# Patient Record
Sex: Male | Born: 1980 | Race: White | Hispanic: No | Marital: Married | Smoking: Current every day smoker
Health system: Southern US, Community
[De-identification: ages and names within clinical notes are randomized; demographics above are authoritative.]

## PROBLEM LIST (undated history)

## (undated) DIAGNOSIS — N289 Disorder of kidney and ureter, unspecified: Secondary | ICD-10-CM

## (undated) DIAGNOSIS — K4090 Unilateral inguinal hernia, without obstruction or gangrene, not specified as recurrent: Secondary | ICD-10-CM

## (undated) HISTORY — PX: WRIST SURGERY: SHX841

---
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
## 2001-01-10 ENCOUNTER — Emergency Department (HOSPITAL_COMMUNITY): Admission: EM | Admit: 2001-01-10 | Discharge: 2001-01-11 | Payer: Self-pay | Admitting: Emergency Medicine

## 2001-01-11 ENCOUNTER — Encounter: Payer: Self-pay | Admitting: Emergency Medicine

## 2001-01-25 ENCOUNTER — Encounter: Payer: Self-pay | Admitting: Orthopedic Surgery

## 2001-01-25 ENCOUNTER — Ambulatory Visit (HOSPITAL_COMMUNITY): Admission: RE | Admit: 2001-01-25 | Discharge: 2001-01-25 | Payer: Self-pay | Admitting: Orthopedic Surgery

## 2001-03-11 ENCOUNTER — Ambulatory Visit (HOSPITAL_BASED_OUTPATIENT_CLINIC_OR_DEPARTMENT_OTHER): Admission: RE | Admit: 2001-03-11 | Discharge: 2001-03-11 | Payer: Self-pay | Admitting: Orthopedic Surgery

## 2002-08-31 ENCOUNTER — Encounter: Payer: Self-pay | Admitting: Emergency Medicine

## 2002-08-31 ENCOUNTER — Emergency Department (HOSPITAL_COMMUNITY): Admission: EM | Admit: 2002-08-31 | Discharge: 2002-08-31 | Payer: Self-pay | Admitting: Emergency Medicine

## 2003-04-30 ENCOUNTER — Emergency Department (HOSPITAL_COMMUNITY): Admission: EM | Admit: 2003-04-30 | Discharge: 2003-04-30 | Payer: Self-pay | Admitting: *Deleted

## 2005-01-03 ENCOUNTER — Emergency Department (HOSPITAL_COMMUNITY): Admission: EM | Admit: 2005-01-03 | Discharge: 2005-01-03 | Payer: Self-pay | Admitting: Emergency Medicine

## 2006-03-25 ENCOUNTER — Emergency Department (HOSPITAL_COMMUNITY): Admission: EM | Admit: 2006-03-25 | Discharge: 2006-03-25 | Payer: Self-pay | Admitting: Emergency Medicine

## 2006-12-15 ENCOUNTER — Ambulatory Visit (HOSPITAL_BASED_OUTPATIENT_CLINIC_OR_DEPARTMENT_OTHER): Admission: RE | Admit: 2006-12-15 | Discharge: 2006-12-16 | Payer: Self-pay | Admitting: Orthopedic Surgery

## 2006-12-15 ENCOUNTER — Encounter (INDEPENDENT_AMBULATORY_CARE_PROVIDER_SITE_OTHER): Payer: Self-pay | Admitting: Specialist

## 2007-03-01 ENCOUNTER — Ambulatory Visit (HOSPITAL_BASED_OUTPATIENT_CLINIC_OR_DEPARTMENT_OTHER): Admission: RE | Admit: 2007-03-01 | Discharge: 2007-03-01 | Payer: Self-pay | Admitting: Orthopedic Surgery

## 2007-08-19 ENCOUNTER — Emergency Department (HOSPITAL_COMMUNITY): Admission: EM | Admit: 2007-08-19 | Discharge: 2007-08-19 | Payer: Self-pay | Admitting: Emergency Medicine

## 2011-02-10 NOTE — Op Note (Signed)
NAME:  FARMER, MCCAHILL NO.:  [id number]   MEDICAL RECORD NO.:  [id number]          PATIENT TYPE:  AMB   LOCATION:  DSC                          FACILITY:  [facility]   PHYSICIAN:  Katy Fitch. Sypher, M.D. DATE OF BIRTH:  1980-11-13   DATE OF PROCEDURE:  03/01/2007  DATE OF DISCHARGE:                               OPERATIVE REPORT   PREOPERATIVE DIAGNOSIS:  Status post autogenous iliac crest bone graft  to reconstruct a chronic right scaphoid nonunion with two interosseous  and submuscular 0.045-inch Kirschner wires.   POSTOPERATIVE DIAGNOSIS:  Status post autogenous iliac crest bone graft  to reconstruct a chronic right scaphoid nonunion with two interosseous  and submuscular 0.045-inch Kirschner wires.   OPERATION:  Removal of two scaphoid and submuscular Kirschner wires from  right wrist.   OPERATIONS:  Katy Fitch. Sypher, M.D.   ASSISTANT:  No assistant.   ANESTHESIA:  General by [facility].   SUPERVISING ANESTHESIOLOGIST:  Sheldon Silvan, M.D.   INDICATIONS:  Nesanel Aguila is a 30 year old gentleman who is status  post fracture of his right scaphoid.  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  He  presented for evaluation and management of a painful and stiff wrist and  was noted to have a chronic middle third nonunion.  He is now  approaching three months status post ORIF with an autogenous iliac crest  cancellus graft and parallel 0.045 inch Kirschner wire internal  fixation.  These K-wires were buried in the thenar muscles to facilitate  postoperative removal.   After informed consent, he is brought to the operating at this time.  He  was carefully advised that he must not return to riding his motorcycle  nor other vigorous activities until we are certain that his scaphoid is  healed once his Kirschner wires are removed.  Preoperatively  questions  were invited and answered.   After an anesthesia consult with Dr. Ivin Booty, it was elected to attempt  pin removal under MAC anesthesia, however, given his large size and the  depth of the pins it may be necessary to convert to general anesthesia.   After informed consent he is brought to the operating room at this time.   PROCEDURE:  Jancarlo Biermann was brought to the operating room and placed  in the supine position upon the operating table.   Following the induction of light sedation, the right arm was prepped  with Betadine soap and solution and sterilely draped.  The arm was  elevated for one minute and the arterial tourniquet inflated to 260 mmHg  due to mild systolic hypertension.   We infiltrated 2% lidocaine in the path of the intended incision.  This  was a bit uncomfortable for Mr. Filip and due to his level of sedation  and withdrawal reaction, it was decided to proceed with general  anesthesia by mask.  Further Diprivan and general anesthetic agents were  provided.  When anesthesia was satisfactory, we proceeded to perform a  small elliptical  excision of scar from the thenar eminence directly over  the position of the pins.  Subcutaneous tissues were divided with  tenotomy scissors and gentle dissection.  The pins were identified by  palpation followed by muscle-splitting and use of a medium needle driver  to remove the two parallel Kirschner wires.  The wound was inspected for  bleeding.  None was identified.  The wound was subsequently repaired  with two Steri-Strips.   A voluminous sterile gauze dressing with sterile Webril and forearm  based thumb Spica splint applied.  There were no apparent complications.  Mr. Raper was awakened from his sedation, transferred to the recovery  room with stable signs.  He will return to our office for follow-up and  a check x-ray in one week.  For aftercare he is provided prescription  for Vicodin 5 mg one p.o. q.4-6h.  p.r.n. pain, 20 tablets without  refill.   We will see him back sooner p.r.n. problems with his splint or other  postoperative issues.      Katy Fitch Sypher, M.D.  Electronically Signed     RVS/MEDQ  D:  03/01/2007  T:  03/01/2007  Job:  [id number]

## 2011-02-13 NOTE — Op Note (Signed)
NAME:  Reginald Castillo, Reginald Castillo NO.:  [id number]   MEDICAL RECORD NO.:  [id number]          PATIENT TYPE:  AMB   LOCATION:  DSC                          FACILITY:  [facility]   PHYSICIAN:  Katy Fitch. Sypher, M.D. 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   OPERATION:  1. Curettage of avascular bone from right scaphoid proximal pole      avascular fracture fragment.  2. Open reduction internal fixation of right scaphoid utilizing an      autogenous cancellus iliac bone graft and fixation with two 0.045-      inch Kirschner wires that were buried deep to the thenar muscles.   OPERATING SURGEON:  Josephine Igo, M.D.   ASSISTANT:  Molly Maduro Dasnoit PA-C.   ANESTHESIA:  General by [facility], supervising anesthesiologist is Dr. Krista Blue.   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   We have treated him previously for a right scaphoid fracture sustained  while playing high school  football in 2001.   At the time of his initial injury, he did not seek treatment thinking  that he had sprained his wrist.  He subsequently presented for  evaluation of his wrist in 2002 and was noted to have an established  proximal pole nonunion.   We recommended open reduction internal fixation with an autogenous  graft.  Surgery was accomplished on March 11, 2001 with placement of a  bone graft and pin fixation.  On 06/16/2001 he subsequently underwent  removal of buried Kirschner wires.  He was followed for a very brief  period following pin removal and was last seen on 07/11/2001.   At that time it appeared that his fracture was proceeding to a  satisfactory union.  He was essentially lost to follow-up for the  subsequent 5-1/2 years.  He returned our office on November 25, 2006,  reporting the onset of new pain in his wrist and was worried that he may  have refractured his scaphoid.  X-rays obtained at our office at that  time indeed documented that he had an established nonunion of the  proximal pole with considerable sclerosis of the proximal pole  suggesting avascular necrosis.   We advised Reginald Castillo that this is  a complex predicament, particularly  when he is now approaching seven years following his original injury.  However, we recommended a second attempt at open reduction internal  fixation utilizing an autogenous cancellous graft from the iliac crest  to optimize our healing potential.   Preoperatively he was advised of the technical issues involved in  fracture fixation with a small proximal pole fragment.  We advised him  that we would likely use either a mini Accu-Trac screw or Kirschner  wires.   He does understand from his past experience that we will likely need to  retrieve the hardware and that he may have some challenges with the  buried hardware in the early postoperative period.  We have explained  the technique of iliac grafting and have shared with Mr.  Castillo that he  may find that his iliac graft site may be more painful than the wrist  surgical site.  He understands that he may limp for a period of time and  could have prolonged pain that requires additional treatment at the  donor site.   In our experience, donor site pain is extremely where but not unheard  of.   After informed consent he is brought to the operating room at this time.   PROCEDURE:  [name] is brought to the operating room and  placed in supine position on the operating table.   Following an anesthesia consult, general anesthesia was recommended and  accepted.   He was taken to room 1, placed in supine position on the operating table  and after the induction of general anesthesia by LMA technique, the  right arm and right iliac crest region were prepped with Betadine soap  and solution and DuraPrep at the hip site.   A pneumatic tourniquet was applied at proximal right brachium.   Following exsanguination of right arm with an Esmarch bandage, the  arterial tourniquet was inflated to 250 mmHg due to mild systolic  hypertension.   Procedure commenced with resection of the prior surgical scar.  Subcutaneous tissue was carefully divided taking care to identify and  gently retract the radial sensory branches.  The fourth dorsal  compartment was released over a distance of 8 mm to allow retraction of  the extensor tendons and a dorsal capsulotomy performed across the floor  of the second, third and part of the fourth dorsal compartment.  The  nonunion site was easily visualized with multiple free fragments of  avascular bone noted at the nonunion site essentially trapped in an  amorphous hyaline cartilage nonunion.   An osteotome was used to resect the nonunion followed by use of a  curette and power bur to excavate the proximal pole.  Samples of bone from the proximal pole marrow were absolutely avascular and had the  consistency of ivory.  The  distal pole had bleeding cancellus bone.   After complete excavation of proximal pole, AP lateral C-arm images were  obtained documenting satisfactory preparation of the bone.  Attention  was then directed to [name] right anterior iliac crest.  A 2 cm  incision was fashioned and carried down through a considerable amount of  adipose tissue.  The iliac crest was identified and the interval between  the external oblique muscle and the tensor fascia lata was identified.  A 15 mm incision was fashioned elevating the periosteum.  A bone graft  measuring 6 mm in width, 8 mm in length and 8 mm in depth was harvested.  About  1 cc of cancellus bone was then removed with a large caliber  curette.  The donor site was then repaired with mattress suture of 0-0  Vicryl, creating a watertight closure of the fascia and the aponeurosis  of the external oblique muscle and the tensor fascia lata.  Subcutaneous  tissues repaired with 0-0 Vicryl closing the dead space created in the  subcutaneous fat.   The cancellus graft was then carefully packed at the nonunion site into  the proximal pole cavity created by burring and curettage.  The fragment  of the iliac graft was used to lengthen the scaphoid initially by  compressing it with a needle driver inserting it and allowing it to  expand within the cavity created in the proximal distal poles.   Two 0.045 inches Kirschner wires were then placed with parallel  technique with a drill guide through the proximal and distal pole of the  scaphoid trapping the large fragment of iliac graft and satisfactorily  immobilizing the scaphoid.   One of the pins grazed the distal trapezium; however, given the  challenges to obtain good pin placement in the setting, I accepted the  fact that the scaphotrapezial joint will be temporarily secured by the  pin.   Mr. Shipes will be protected throughout healing with a cast until we  remove the pins.   AP lateral  images were obtained documenting very satisfactory position  of the graft as well as the Kirschner wires which were parallel and  should allow gradual compression with wrist positioning in slight  dorsiflexion and radial deviation.   The pins were trimmed and tamped below the level the hyaline cartilage.  These were brought out to the thenar muscles and through a separate  incision the pins bent and buried within the thenar muscles.   Mr. Duchemin understands that these will be annoying in the thenar muscles  but should allow [place] easy access for pin removal once the scaphoid  appears to have adequate healing at about 10-12 weeks postop.   The pin tracts were carefully inspected through a thenar incision to be  certain that there were no neurovascular structures entrapped or other  muscle issues.  The pins will lie within the abductor pollicis brevis  muscle.  The wounds were then irrigated followed by repair of the capsule with  multiple figure-of-eight sutures of 3-0 Ethibond followed by repair of  the retinaculum with 3-0 Ethibond simple suture and repair of the skin  with subdermal suture of 3-0 Vicryl and intradermal 3-0 Prolene.   Mr. Stroupe was placed in compressive dressing on the hand followed by a  sugar-tong splint maintaining the forearm in neutral position and the  wrist in a few degrees dorsiflexion.  There were no apparent  complications.  The tourniquet released.  Tourniquet pressure was 250  mmHg throughout procedure.   The iliac graft site was dressed with sterile gauze and Tegaderm.  We  anticipate observing Mr. Haug for 24 hours for IV antibiotics in form  of Ancef 1 gram IV q.8 h. as a prophylactic antibiotic and appropriate  pain medication to manage both operative sites.      Katy Fitch Sypher, M.D.  Electronically Signed    RVS/MEDQ  D:  12/15/2006  T:  12/15/2006  Job:  [id number]

## 2011-02-13 NOTE — Op Note (Signed)
[place]  Patient:    Reginald Castillo, Reginald Castillo                       MRN: [id number] Proc. Date: 03/11/01 Adm. 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  OPERATION/PROCEDURE: Open reduction and internal fixation of right scaphoid proximal pole fracture with autogenous distal radial bone graft.  SURGEON: Katy Fitch. Sypher, Montez Hageman., M.D.  ASSISTANT: Jonni Sanger, P.A.  ANESTHESIA: General by LMA.  ANESTHESIOLOGIST: Janetta Hora. Gelene Mink, M.D.  INDICATIONS FOR PROCEDURE: Reginald Castillo is a 30 year old who sustained a fracture of the proximal pole of his right scaphoid approximately nine months previously while playing football.  He was noted to have a possible fracture versus a sprain.  He did not seek acute medical evaluation and care.  He finished his football season and continued to have wrist discomfort.  He was evaluated by his primary care physician and referred for an upper endoscopy orthopedic consultation.  Clinical examination revealed wrist tenderness, loss of dorsiflexion, and loss of grip strength.  X-rays documented a displaced proximal pole fracture with increased density consistent with avascular necrosis.  He was referred for an MRI, which confirmed avascular necrosis of the proximal pole and significant edema of the distal fragment.  We recommended autogenous grafting and either Acutrak screw or Kirschner wire fixation.  Preoperatively we advised Mr. Cuartas that this was a  complex injury with difficult biological circumstances.  He was advised that the proximal pole does not have an adequate blood supply to allow primary healing in a cast. The goal of bone grafting and internal fixation is to increase the chance of revascularization of the proximal pole fragment and healing of the fracture. He was advised on two occasions in the office and in the preoperative holding area that there are potential complications of surgery including infection, anesthetic risks, a possible failure of fixation, possible failure of union, and the need for further surgery should he not go on to a primary union after this effort.  In the holding area he could not recall most of our original consultation. Therefore, I had a detailed informed consent with two witnesses present; i.e., Marveen Reeks Dasnoit, P.A. and one of the staff nurses in the preoperative holding area.  In addition, I had asked his parents to come to the office for preoperative consultation prior to surgery.  Due to family illness they were unable to attend this in the week prior to surgery.  Therefore, I contacted Christa See mother on the phone this morning and had a five minute conversation with her describing the surgery, aftercare, potential risks and benefits, as well as the possibility that this would not heal and would require further surgery.  Both Reginald Castillo and his mother understand that should this fracture not heal he is at risk for developing premature wrist arthritis.  After informed consent and questions being answered he is brought to the operating room at this time.  DESCRIPTION OF PROCEDURE: Dewitt Judice was  brought to the operating room and placed in the supine position on the operating room table.  Following induction of general anesthesia by LMA the right arm was prepped with Betadine soap and solution and sterilely draped.  The right anterior iliac crest region was prepped with  DuraPrep and drained with impervious arthroscopy drapes.  The procedure commenced with exsanguination of the limb with an Esmarch bandage and inflation of the arterial tourniquet to 240 mm Hg.  The fracture was exposed through a dorsal transverse incision, exposing the second, third, and fourth dorsal compartments.  The dorsal wrist capsule was incised in the line of the fibers of the dorsal radiocarpal ligament, exposing the proximal pole of the scaphoid and scapholunate interosseous ligament.  The scapholunate interosseous ligament was intact.  The proximal pole fracture was impacted with some loss of normal hyaline reticular cartilage dorsally.  There was a mild humpback deformity noted.  The fracture site was easily sounded with a 25 gauge needle and opened with a 4 mm osteotome.  There was absolutely no healing.  With a House curet a small portion of the marrow of the distal fragment was removed, creating a concavity to accept bone graft.  There was excellent bleeding in the distal fragment. The marrow of the proximal pole fragment was saponified and very sclerotic. This was with great difficulty resected with a House curet with use of suction to remove bony debris.  The wrist was then thoroughly irrigated with sterile saline.  Attention was directed to the distal radius.  The floor of the second dorsal compartment was exposed by retracting the skin proximally.  The dorsal cortex was windowed with a 4 mm osteotome and a bone graft measuring 6 x 4 x 4 mm was harvested with osteotomes.  He had excellent bone quality in the distal radius due to his youth and heavy physical exercise.  This was fashioned to a flattened football shape and was then placed in the defect created by curettage, and the scaphoid was impacted by dorsiflexion of the wrist and radial deviation with firm pressure.  The bone graft filled the cavity created by bone resection quite satisfactorily.  Two 0.045  inch Kirschner wires were then drilled the length of the scaphoid from dorsal to palmar, taking care to exit the distal pole adjacent to the scaphotrapezial  articulation.  These were brought through the thenar muscles and under direct vision were brought distally until they were buried within the proximal pole of the scaphoid.  The fracture was then impacted with a small impactor with a mallet, and care was taken to assure that the Kirschner wires were deep to the surface of the hyaline articular cartilage at least 1 mm.  A C-arm fluoroscopic image was obtained and documented satisfactory position of the fracture and satisfactory position of the Kirschner wires.  The wires were then bent and trimmed and buried deep to the skin in the thenar eminence.  These will be removed by secondary surgery approximately six to ten weeks following surgery if we have satisfactory healing of the fracture noted.  The wound was then thoroughly irrigated and the capsule repaired with interrupted sutures of 4-0 Vicryl, followed by repair of the retinaculum with interrupted sutures of 4-0 Vicryl, and repair of the skin with interdermal 3-0 Prolene suture.  The hand was placed in a voluminous gauze dressing with the wrist dorsiflexed 30 degrees and slightly radially deviated to impact the fracture.  There were no apparent complications.  Mr. Currier was  given 1 g of Ancef as an IV prophylatic antibiotic.  For aftercare he will be discharged home to the care of his parents with prescriptions for Motrin 600 mg one p.o. q.6h p.r.n. pain, Percocet 5 mg one to two tablets p.o. q.4h to q.6h p.r.n. pain (#30 tablets without refill), and Keflex 500 mg one p.o. q.8h x 4 days as prophylactic antibiotic. DD:  03/11/01 TD:  03/11/01 Job: [id number]

## 2011-03-19 ENCOUNTER — Emergency Department (HOSPITAL_COMMUNITY): Payer: 59

## 2011-03-19 ENCOUNTER — Emergency Department (HOSPITAL_COMMUNITY)
Admission: EM | Admit: 2011-03-19 | Discharge: 2011-03-19 | Disposition: A | Discharge status: Home / Self Care | Payer: 59 | Attending: Emergency Medicine | Admitting: Emergency Medicine

## 2011-03-19 DIAGNOSIS — R079 Chest pain, unspecified: Secondary | ICD-10-CM | POA: Insufficient documentation

## 2011-03-19 DIAGNOSIS — R6883 Chills (without fever): Secondary | ICD-10-CM | POA: Insufficient documentation

## 2011-03-19 DIAGNOSIS — R300 Dysuria: Secondary | ICD-10-CM | POA: Insufficient documentation

## 2011-03-19 DIAGNOSIS — R109 Unspecified abdominal pain: Secondary | ICD-10-CM | POA: Insufficient documentation

## 2011-03-19 DIAGNOSIS — R112 Nausea with vomiting, unspecified: Secondary | ICD-10-CM | POA: Insufficient documentation

## 2011-03-19 DIAGNOSIS — N201 Calculus of ureter: Secondary | ICD-10-CM | POA: Insufficient documentation

## 2011-03-19 LAB — URINALYSIS, ROUTINE W REFLEX MICROSCOPIC
Bilirubin Urine: NEGATIVE
Glucose, UA: NEGATIVE mg/dL
Leukocytes, UA: NEGATIVE
Protein, ur: NEGATIVE mg/dL
Specific Gravity, Urine: 1.028 (ref 1.005–1.030)
Urobilinogen, UA: 0.2 mg/dL (ref 0.0–1.0)
pH: 5.5 (ref 5.0–8.0)

## 2011-03-19 LAB — DIFFERENTIAL
Eosinophils Absolute: 0.5 10*3/uL (ref 0.0–0.7)
Lymphs Abs: 3.7 10*3/uL (ref 0.7–4.0)
Monocytes Relative: 10 % (ref 3–12)

## 2011-03-19 LAB — CBC
Hemoglobin: 17 g/dL (ref 13.0–17.0)
MCH: 30.5 pg (ref 26.0–34.0)
MCHC: 35.9 g/dL (ref 30.0–36.0)
MCV: 84.8 fL (ref 78.0–100.0)
Platelets: 344 10*3/uL (ref 150–400)
RBC: 5.58 MIL/uL (ref 4.22–5.81)

## 2011-03-19 LAB — BASIC METABOLIC PANEL
Calcium: 9.8 mg/dL (ref 8.4–10.5)
Creatinine, Ser: 1.03 mg/dL (ref 0.50–1.35)
GFR calc Af Amer: >60 mL/min (ref >60)
GFR calc non Af Amer: >60 mL/min (ref >60)
Potassium: 3.8 mEq/L (ref 3.5–5.1)
Sodium: 140 mEq/L (ref 135–145)

## 2012-02-10 ENCOUNTER — Emergency Department (HOSPITAL_COMMUNITY)
Admission: EM | Admit: 2012-02-10 | Discharge: 2012-02-10 | Disposition: A | Discharge status: Home / Self Care | Payer: 59 | Attending: Emergency Medicine | Admitting: Emergency Medicine

## 2012-02-10 ENCOUNTER — Encounter (HOSPITAL_COMMUNITY): Payer: Self-pay

## 2012-02-10 DIAGNOSIS — T622X1A Toxic effect of other ingested (parts of) plant(s), accidental (unintentional), initial encounter: Secondary | ICD-10-CM | POA: Insufficient documentation

## 2012-02-10 DIAGNOSIS — R030 Elevated blood-pressure reading, without diagnosis of hypertension: Secondary | ICD-10-CM | POA: Insufficient documentation

## 2012-02-10 DIAGNOSIS — L255 Unspecified contact dermatitis due to plants, except food: Secondary | ICD-10-CM | POA: Insufficient documentation

## 2012-02-10 DIAGNOSIS — L237 Allergic contact dermatitis due to plants, except food: Secondary | ICD-10-CM

## 2012-02-10 DIAGNOSIS — F17200 Nicotine dependence, unspecified, uncomplicated: Secondary | ICD-10-CM | POA: Insufficient documentation

## 2012-02-10 MED ORDER — PREDNISONE 20 MG PO TABS
60.0000 mg | ORAL_TABLET | Freq: Once | ORAL | Status: AC
  Administered 2012-02-10: 60 mg via ORAL
  Filled 2012-02-10: qty 3

## 2012-02-10 NOTE — ED Provider Notes (Signed)
History  This chart was scribed for Doug Sou, MD by Bennett Scrape. This patient was seen in room STRE2/STRE2 and the patient's care was started at 11:42PM.  CSN: [id number]  Arrival date & time 02/10/12  1109   First MD Initiated Contact with Patient 02/10/12 1142      Chief Complaint  Patient presents with  . Rash    The history is provided by the patient. No language interpreter was used.    Reginald Castillo is a 31 y.o. male who presents to the Emergency Department complaining of a gradual onset, gradually worsening, constant itchy rash that is on his back, arms and eyelids that appeared 4 days ago after he was out walking in the woods. He reports using creams with no improvement in his symptoms. Scratching improves the itching but he states that the rash spreads afterwards. He also c/o one week of sudden onset, gradually improving abrasion on his upper back. The pain is worse when he sweats. He denies fever, emesis and diarrhea as associated symptoms. He has no h/o chronic medical conditions. He is a current everyday smoker but denies alcohol use.  No PCP.   History reviewed. No pertinent past medical history.  History reviewed. No pertinent past surgical history.  History reviewed. No pertinent family history.  History  Substance Use Topics  . Smoking status: Current Everyday Smoker  . Smokeless tobacco: Not on file  . Alcohol Use: No      Review of Systems  Constitutional: Negative.   HENT: Negative.   Respiratory: Negative.   Cardiovascular: Negative.   Gastrointestinal: Negative.   Musculoskeletal: Negative.   Skin: Positive for rash.  Neurological: Negative.   Hematological: Negative.   Psychiatric/Behavioral: Negative.     Allergies  Review of patient's allergies indicates no known allergies.  Home Medications   Current Outpatient Rx  Name Route Sig Dispense Refill  . OVER THE COUNTER MEDICATION Topical Apply 1 application topically daily as  needed. Poison Ivy cream.      Triage Vitals: BP 151/95  Pulse 67  Temp(Src) 98.1 F (36.7 C) (Oral)  Resp 20  SpO2 100%  Physical Exam  Nursing note and vitals reviewed. Constitutional: He is oriented to person, place, and time. He appears well-developed and well-nourished. No distress.  HENT:  Head: Normocephalic and atraumatic.  Mouth/Throat: Oropharynx is clear and moist. No oropharyngeal exudate.       Handling secretions  Eyes: EOM are normal.  Neck: Neck supple. No tracheal deviation present.  Cardiovascular: Normal rate.   Pulmonary/Chest: Effort normal. No respiratory distress.  Abdominal: He exhibits no distension.  Musculoskeletal: Normal range of motion.  Neurological: He is alert and oriented to person, place, and time.  Skin: Skin is warm and dry. Rash noted.       Hive like rash on both forearms and a pink lesion on eyelids, pinkish streak approximately 10 cm long on upper back that looks like a healing abrasion  Psychiatric: He has a normal mood and affect. His behavior is normal.    ED Course  Procedures (including critical care time)  DIAGNOSTIC STUDIES: Oxygen Saturation is 100% on room air, normal by my interpretation.    COORDINATION OF CARE: 11:45AM-Advised pt to wash bed sheets, wash his hands frequently and to use benadryl as need for the itching. Will give pt prednisone for 2 weeks. Pt is driving himself home.   Labs Reviewed - No data to display No results found.   No diagnosis found.  MDM  Plan: prescription prednisone 60-60 50 50 40 40 30 30 20 20 10  10, benadryl 50 qid prn itch, bp recheck  Diagnosis #1 poison ivy #2 elevated blood pressure   02/10/2012  I personally performed the services described in this documentation, which was scribed in my presence. The recorded information has been reviewed and considered.  Doug Sou, MD 02/10/12 [phone]

## 2012-02-10 NOTE — Discharge Instructions (Signed)
Poison Ivy Take Benadryl 2 tablets or 4 times daily as needed for itch. Take the medicine prescribed which will help to clear up the rash over the next several days. Oatmeal baths may help to see if your skin .You should get your blood pressure rechecked within the next 3 weeks. Today's was high at 151/95 Poison ivy is a rash caused by touching the leaves of the poison ivy plant. The rash often shows up 48 hours later. You might just have bumps, redness, and itching. Sometimes, blisters appear and break open. Your eyes may get puffy (swollen). Poison ivy often heals in 2 to 3 weeks without treatment. HOME CARE  If you touch poison ivy:   Wash your skin with soap and water right away. Wash under your fingernails. Do not rub the skin very hard.   Wash any clothes you were wearing.   Avoid poison ivy in the future. Poison ivy has 3 leaves on a stem.   Use medicine to help with itching as told by your doctor. Do not drive when you take this medicine.   Keep open sores dry, clean, and covered with a bandage and medicated cream, if needed.   Ask your doctor about medicine for children.  GET HELP RIGHT AWAY IF:  You have open sores.   Redness spreads beyond the area of the rash.   There is yellowish white fluid (pus) coming from the rash.   Pain gets worse.   You have a temperature by mouth above 102 F (38.9 C), not controlled by medicine.  MAKE SURE YOU:  Understand these instructions.   Will watch your condition.   Will get help right away if you are not doing well or get worse.  Document Released: 10/17/2010 Document Revised: 09/03/2011 Document Reviewed: 10/17/2010 [facility] Patient Information 2012 [place], [place].

## 2012-02-10 NOTE — ED Notes (Signed)
Pt getting undressed and into a gown at this time 

## 2012-02-10 NOTE — ED Notes (Signed)
Pt sts exposed to poison ivy on Saturday, now with rash to back and eyelids, sts burns

## 2013-03-01 ENCOUNTER — Emergency Department (HOSPITAL_BASED_OUTPATIENT_CLINIC_OR_DEPARTMENT_OTHER)
Admission: EM | Admit: 2013-03-01 | Discharge: 2013-03-01 | Disposition: A | Discharge status: Home / Self Care | Payer: 59 | Attending: Emergency Medicine | Admitting: Emergency Medicine

## 2013-03-01 ENCOUNTER — Emergency Department (HOSPITAL_BASED_OUTPATIENT_CLINIC_OR_DEPARTMENT_OTHER): Payer: 59

## 2013-03-01 ENCOUNTER — Encounter (HOSPITAL_BASED_OUTPATIENT_CLINIC_OR_DEPARTMENT_OTHER): Payer: Self-pay

## 2013-03-01 DIAGNOSIS — F17200 Nicotine dependence, unspecified, uncomplicated: Secondary | ICD-10-CM | POA: Insufficient documentation

## 2013-03-01 DIAGNOSIS — R110 Nausea: Secondary | ICD-10-CM | POA: Insufficient documentation

## 2013-03-01 DIAGNOSIS — K4090 Unilateral inguinal hernia, without obstruction or gangrene, not specified as recurrent: Secondary | ICD-10-CM | POA: Insufficient documentation

## 2013-03-01 LAB — URINALYSIS, ROUTINE W REFLEX MICROSCOPIC
Bilirubin Urine: NEGATIVE
Leukocytes, UA: NEGATIVE
Nitrite: NEGATIVE
Protein, ur: NEGATIVE mg/dL
Urobilinogen, UA: 0.2 mg/dL (ref 0.0–1.0)
pH: 5.5 (ref 5.0–8.0)

## 2013-03-01 LAB — CBC WITH DIFFERENTIAL/PLATELET
Eosinophils Absolute: 0.5 10*3/uL (ref 0.0–0.7)
Eosinophils Relative: 4 % (ref 0–5)
HCT: 46.9 % (ref 39.0–52.0)
MCH: 30.4 pg (ref 26.0–34.0)
MCHC: 35.6 g/dL (ref 30.0–36.0)
MCV: 85.3 fL (ref 78.0–100.0)
Monocytes Absolute: 0.9 10*3/uL (ref 0.1–1.0)
Monocytes Relative: 7 % (ref 3–12)
Neutrophils Relative %: 57 % (ref 43–77)
Platelets: 318 10*3/uL (ref 150–400)
RBC: 5.5 MIL/uL (ref 4.22–5.81)
RDW: 13.9 % (ref 11.5–15.5)

## 2013-03-01 LAB — COMPREHENSIVE METABOLIC PANEL
GFR calc Af Amer: >90 mL/min (ref >90)
Glucose, Bld: 107 mg/dL — ABNORMAL HIGH (ref 70–99)

## 2013-03-01 LAB — LIPASE, BLOOD: Lipase: 24 U/L (ref 11–59)

## 2013-03-01 MED ORDER — IOHEXOL 300 MG/ML  SOLN
100.0000 mL | Freq: Once | INTRAMUSCULAR | Status: AC | PRN
  Administered 2013-03-01: 100 mL via INTRAVENOUS

## 2013-03-01 MED ORDER — SODIUM CHLORIDE 0.9 % IV SOLN
1000.0000 mL | Freq: Once | INTRAVENOUS | Status: AC
  Administered 2013-03-01 (×2): 1000 mL via INTRAVENOUS

## 2013-03-01 MED ORDER — HYDROMORPHONE HCL PF 1 MG/ML IJ SOLN
0.5000 mg | Freq: Once | INTRAMUSCULAR | Status: AC
  Administered 2013-03-01: 0.5 mg via INTRAVENOUS
  Filled 2013-03-01: qty 1

## 2013-03-01 MED ORDER — HYDROCODONE-ACETAMINOPHEN 5-325 MG PO TABS: 1.0000 | ORAL_TABLET | Freq: Four times a day (QID) | ORAL | Status: DC | PRN

## 2013-03-01 MED ORDER — IOHEXOL 300 MG/ML  SOLN
50.0000 mL | Freq: Once | INTRAMUSCULAR | Status: AC | PRN
  Administered 2013-03-01: 50 mL via ORAL

## 2013-03-01 MED ORDER — ONDANSETRON HCL 4 MG/2ML IJ SOLN
4.0000 mg | Freq: Once | INTRAMUSCULAR | Status: AC
  Administered 2013-03-01: 4 mg via INTRAVENOUS
  Filled 2013-03-01: qty 2

## 2013-03-01 NOTE — ED Provider Notes (Addendum)
History     CSN: [id number]  Arrival date & time 03/01/13  1153   First MD Initiated Contact with Patient 03/01/13 1203      Chief Complaint  Patient presents with  . Abdominal Pain    (Consider location/radiation/quality/duration/timing/severity/associated sxs/prior treatment) HPI Patient presents with abdominal pain.  Pain began yesterday and strenuous activity.  Since onset the pain was initially severe, then improved markedly, and is severe again.  The pain is sharp, focally about the left lower quadrant with radiation towards the scrotum. There is no associated dysuria or hematuria, fever, chills. There is mild associated nausea, but no vomiting. No diarrhea. The patient states that he has no chronic medical conditions, no surgical history in his abdomen. Pain is minimally improved at rest, and with positioning. Pain is worse with palpation and exertion. History reviewed. No pertinent past medical history.  Past Surgical History  Procedure Laterality Date  . Wrist surgery      No family history on file.  History  Substance Use Topics  . Smoking status: Current Every Day Smoker  . Smokeless tobacco: Not on file  . Alcohol Use: No      Review of Systems  Constitutional:       Per HPI, otherwise negative  HENT:       Per HPI, otherwise negative  Respiratory:       Per HPI, otherwise negative  Cardiovascular:       Per HPI, otherwise negative  Gastrointestinal: Negative for vomiting.  Endocrine:       Negative aside from HPI  Genitourinary:       Neg aside from HPI   Musculoskeletal:       Per HPI, otherwise negative  Skin: Negative.   Neurological: Negative for syncope.    Allergies  Review of patient's allergies indicates no known allergies.  Home Medications  No current outpatient prescriptions on file.  BP 173/93  Pulse 67  Temp(Src) 98 F (36.7 C) (Oral)  Resp 18  Ht 6' (1.829 m)  Wt 250 lb (113.399 kg)  BMI 33.9 kg/m2  SpO2  100%  Physical Exam  Nursing note and vitals reviewed. Constitutional: He is oriented to person, place, and time. He appears well-developed. No distress.  HENT:  Head: Normocephalic and atraumatic.  Eyes: Conjunctivae and EOM are normal.  Cardiovascular: Normal rate and regular rhythm.   Pulmonary/Chest: Effort normal. No stridor. No respiratory distress.  Abdominal: Normal appearance and bowel sounds are normal. He exhibits no distension. There is no hepatomegaly. There is tenderness in the left lower quadrant. There is guarding. There is no rebound. Hernia confirmed negative in the right inguinal area and confirmed negative in the left inguinal area.  Genitourinary: Testes normal and penis normal. Right testis shows no swelling and no tenderness. Right testis is descended. Left testis shows no swelling and no tenderness. Left testis is descended.  Musculoskeletal: He exhibits no edema.  Lymphadenopathy:       Right: No inguinal adenopathy present.       Left: No inguinal adenopathy present.  Neurological: He is alert and oriented to person, place, and time.  Skin: Skin is warm and dry.  Psychiatric: He has a normal mood and affect.    ED Course  Procedures (including critical care time)  Labs Reviewed  CBC WITH DIFFERENTIAL - Abnormal; Notable for the following:    WBC 12.5 (*)    All other components within normal limits  URINALYSIS, ROUTINE W REFLEX MICROSCOPIC  COMPREHENSIVE  METABOLIC PANEL  LIPASE, BLOOD   No results found.   No diagnosis found.  Labs and demonstrate largely reassuring, there is evidence of a small scrotal hernia with small entrapment.  No bowel involvement.  MDM  This patient presents after the acute onset of abdominal pain and which has persisted since yesterday.  On exam he is awake and alert, there is tenderness to palpation about the left lower quadrant of his abdomen.  Given the patient's prescription of pain that began after exertion, or suspicion  for hernia, with consideration of incarceration versus stimulation. Patient is afebrile, otherwise well-appearing, and after receiving analgesics, he was discharged in stable condition to follow up with the surgeon for further consideration of surgical repair electively. Absent distress, fever, peritonitis, CT evidence of incarceration of bowel (but w demonstration of fat containing hernia), the patient is appropriate for outpatient management.        Gerhard Munch, MD 03/01/13 1610  Gerhard Munch, MD 03/01/13 [phone]

## 2013-03-01 NOTE — ED Notes (Signed)
Pt reports sudden onset of abdominal pain that started PTA while at work while lifting a heavy object.

## 2013-03-01 NOTE — ED Notes (Signed)
PO contrast provided by CT Tech. 

## 2013-03-01 NOTE — ED Notes (Signed)
Patient transported to CT 

## 2013-03-06 ENCOUNTER — Emergency Department (HOSPITAL_BASED_OUTPATIENT_CLINIC_OR_DEPARTMENT_OTHER)
Admission: EM | Admit: 2013-03-06 | Discharge: 2013-03-06 | Disposition: A | Discharge status: Home / Self Care | Payer: 59 | Attending: Emergency Medicine | Admitting: Emergency Medicine

## 2013-03-06 ENCOUNTER — Encounter (HOSPITAL_BASED_OUTPATIENT_CLINIC_OR_DEPARTMENT_OTHER): Payer: Self-pay | Admitting: *Deleted

## 2013-03-06 DIAGNOSIS — K4090 Unilateral inguinal hernia, without obstruction or gangrene, not specified as recurrent: Secondary | ICD-10-CM

## 2013-03-06 DIAGNOSIS — F17200 Nicotine dependence, unspecified, uncomplicated: Secondary | ICD-10-CM | POA: Insufficient documentation

## 2013-03-06 DIAGNOSIS — K4030 Unilateral inguinal hernia, with obstruction, without gangrene, not specified as recurrent: Secondary | ICD-10-CM | POA: Insufficient documentation

## 2013-03-06 LAB — URINE MICROSCOPIC-ADD ON

## 2013-03-06 LAB — URINALYSIS, ROUTINE W REFLEX MICROSCOPIC
Glucose, UA: NEGATIVE mg/dL
Ketones, ur: 15 mg/dL — AB
Leukocytes, UA: NEGATIVE
Nitrite: NEGATIVE
Protein, ur: NEGATIVE mg/dL

## 2013-03-06 MED ORDER — HYDROCODONE-ACETAMINOPHEN 5-325 MG PO TABS: 2.0000 | ORAL_TABLET | ORAL | Status: DC | PRN

## 2013-03-06 NOTE — ED Notes (Signed)
Abdominal pain. States he was diagnosed with a hernia last week. Grabbed his daughter and pain has been bad since.

## 2013-03-06 NOTE — Discharge Instructions (Signed)
Hernia A hernia happens when an organ inside your body pushes out through a weak spot in your belly (abdominal) wall. Most hernias get worse over time. They can often be pushed back into place (reduced). Surgery may be needed to repair hernias that cannot be pushed into place. HOME CARE  Keep doing normal activities.  Avoid lifting more than 10 pounds (4.5 kilograms).  Cough gently and avoid straining. Over time, these things will:  Increase your hernia size.  Irritate your hernia.  Break down hernia repairs.  Stop smoking.  Do not wear anything tight over your hernia. Do not keep the hernia in with an outside bandage.  Eat food that is high in fiber (fruit, vegetables, whole grains).  Drink enough fluids to keep your pee (urine) clear or pale yellow.  Take medicines to make your poop soft (stool softeners) if you cannot poop (constipated). GET HELP RIGHT AWAY IF:   You have a fever, vomiting, severe pain, abdominal pain, testicular pain.  You have belly pain that gets worse.  You feel sick to your stomach (nauseous) and throw up (vomit).  Your skin starts to bulge out.  Your hernia turns a different color, feels hard, or is tender.  You have increased pain or puffiness (swelling) around the hernia.  You poop more or less often.  Your poop does not look the way normally does.  You have watery poop (diarrhea).  You cannot push the hernia back in place by applying gentle pressure while lying down. MAKE SURE YOU:   Understand these instructions.  Do not lift over 10 pounds for 2 weeks.  Will watch your condition.  Will get help right away if you are not doing well or get worse. Document Released: 03/04/2010 Document Revised: 12/07/2011 Document Reviewed: 03/04/2010 [facility] Patient Information 2014 [place], [place].

## 2013-03-06 NOTE — ED Provider Notes (Signed)
History    This chart was scribed for Reginald Horn, MD by Donne Anon, ED Scribe. This patient was seen in room MH02/MH02 and the patient's care was started at 1659.   CSN: [id number]  Arrival date & time 03/06/13  1650   First MD Initiated Contact with Patient 03/06/13 1659      Chief Complaint  Patient presents with  . Abdominal Pain     The history is provided by the patient. No language interpreter was used.   HPI Comments: AYAD NIEMAN is a 32 y.o. male who presents to the Emergency Department complaining of 1 week of sudden onset, gradually worsening, waxing and waning, constant left lower abdominal pain. He states the initial onset was 1 week ago, was controlled the past week with pain medication, and suddenly worsened last night when he grabbed his daughter to prevent her from falling off of the bed. He describes the pain as sharp and stabbing with mild radiate to his left testicle. He was seen in the ED on 03/01/13 and diagnosed with a fat containing hernia.  He denies nausea, vomiting, diarrhea, CP, dysuria, frequency, hematuria, hematochezia, numbness or any other pain. He has been seen by [facility] previously for wrist surgery but denies any past abdominal surgical history.   History reviewed. No pertinent past medical history.  Past Surgical History  Procedure Laterality Date  . Wrist surgery      No family history on file.  History  Substance Use Topics  . Smoking status: Current Every Day Smoker  . Smokeless tobacco: Not on file  . Alcohol Use: No      Review of Systems A complete 10 system review of systems was obtained and all systems are negative except as noted in the HPI and PMH.   Allergies  Review of patient's allergies indicates no known allergies.  Home Medications   Current Outpatient Rx  Name  Route  Sig  Dispense  Refill  . HYDROcodone-acetaminophen (NORCO) 5-325 MG per tablet   Oral   Take 2 tablets by mouth every 4  (four) hours as needed for pain.   15 tablet   0   . HYDROcodone-acetaminophen (NORCO/VICODIN) 5-325 MG per tablet   Oral   Take 1 tablet by mouth every 6 (six) hours as needed for pain.   10 tablet   0     BP 146/83  Pulse 84  Temp(Src) 98.6 F (37 C) (Oral)  Resp 16  Ht 6' (1.829 m)  Wt 250 lb (113.399 kg)  BMI 33.9 kg/m2  SpO2 99%  Physical Exam  Nursing note and vitals reviewed. Constitutional:  Awake, alert, nontoxic appearance.  HENT:  Head: Atraumatic.  Eyes: Right eye exhibits no discharge. Left eye exhibits no discharge.  Neck: Neck supple.  Cardiovascular: Normal rate, regular rhythm and normal heart sounds.   Pulmonary/Chest: Effort normal and breath sounds normal. He exhibits no tenderness.  Abdominal: Soft. Bowel sounds are normal. There is tenderness. There is no rebound.  Ride side non tender. LUQ mildly tender. LLQ moderately tender.  Genitourinary:  Left inguinal crease is moderately tender to palpation. Bilateral testicle non tender. Left inguinal canal is tender without palpable hernia.    Musculoskeletal: He exhibits no tenderness.  Baseline ROM, no obvious new focal weakness.  Neurological:  Mental status and motor strength appears baseline for patient and situation.  Skin: No rash noted.  Psychiatric: He has a normal mood and affect.    ED Course  Procedures (including critical care time) DIAGNOSTIC STUDIES: Oxygen Saturation is 99% on RA, normal by my interpretation.    COORDINATION OF CARE: 5:14 PM Patient / Family / Caregiver understand and agree with initial ED impression and plan with expectations set for ED visit. Will consult with [facility].   5:40 PM Case discussed with [facility] who recommends outpatient follow up and having the pt lift no more than 10 pounds for 2 weeks.  5:49 PM Rechecked pt. Patient / Family / Caregiver informed of clinical course, understand medical decision-making process, and  agree with plan. Pt understand and agrees to not lifting more than 10 lbs for 2 weeks.     Labs Reviewed  URINALYSIS, ROUTINE W REFLEX MICROSCOPIC - Abnormal; Notable for the following:    Hgb urine dipstick TRACE (*)    Ketones, ur 15 (*)    All other components within normal limits  URINE MICROSCOPIC-ADD ON   No results found.   1. Left inguinal hernia       MDM  I doubt any other EMC precluding discharge at this time including, but not necessarily limited to the following: Incarcerated or strangulated intestinal hernia.   I personally performed the services described in this documentation, which was scribed in my presence. The recorded information has been reviewed and is accurate.           Reginald Horn, MD 03/06/13 2118

## 2013-03-13 ENCOUNTER — Ambulatory Visit (INDEPENDENT_AMBULATORY_CARE_PROVIDER_SITE_OTHER): Payer: 59 | Admitting: Surgery

## 2013-03-13 ENCOUNTER — Encounter (INDEPENDENT_AMBULATORY_CARE_PROVIDER_SITE_OTHER): Payer: Self-pay | Admitting: Surgery

## 2013-03-13 VITALS — BP 126/72 | HR 68 | Temp 97.6°F | Resp 16 | Ht 72.0 in | Wt 255.0 lb

## 2013-03-13 DIAGNOSIS — K4090 Unilateral inguinal hernia, without obstruction or gangrene, not specified as recurrent: Secondary | ICD-10-CM | POA: Insufficient documentation

## 2013-03-13 NOTE — Progress Notes (Signed)
Subjective:     Patient ID: Reginald Castillo, male   DOB: 12-May-1981, 32 y.o.   MRN: [id number]  HPI  Reginald Castillo  Oct 06, 1980 [id number]  Patient Care Team: Provider Default, MD as PCP - General (Orthopedic Surgery)  This patient is a 32 y.o.male who presents today for surgical evaluation at the request of Dr. Fonnie Jarvis, ED MD [facility].   Reason for visit: Left groin pain with hernia  Pleasant active male.  Smoker.  Weaning down from two packs a day to half a pack a day.  Had an episode of severe sharp pain while doing heavy lifting at work.  Went to emergency room.  Diagnosed with a small but definite inguinal hernia.  Triggered severe pain while helping to protect this child a few days later.  Sent to me for surgical evaluation.  Patient active with recreational four-wheeling and heavy lifting at work.  Bowel movement twice a day.  No history of abdominal hernia surgery.  No history of skin infections.  Pain is more intermittent now.  No major bulging.   No exertional chest/neck/shoulder/arm pain.  Patient can walk 60 minutes for about 2 miles without difficulty.    There are no active problems to display for this patient.   History reviewed. No pertinent past medical history.  Past Surgical History  Procedure Laterality Date  . Wrist surgery      History   Social History  . Marital Status: Married    Spouse Name: N/A    Number of Children: N/A  . Years of Education: N/A   Occupational History  . Not on file.   Social History Main Topics  . Smoking status: Current Every Day Smoker -- 0.50 packs/day  . Smokeless tobacco: Never Used  . Alcohol Use: No  . Drug Use: Yes    Special: Marijuana  . Sexually Active: Not on file   Other Topics Concern  . Not on file   Social History Narrative  . No narrative on file    Family History  Problem Relation Age of Onset  . Cancer Mother     breast  . Cancer Maternal Grandmother     breast  . Cancer Maternal Grandfather     prostate  . Cancer Paternal Grandfather     prostate    Current Outpatient Prescriptions  Medication Sig Dispense Refill  . HYDROcodone-acetaminophen (NORCO) 5-325 MG per tablet Take 2 tablets by mouth every 4 (four) hours as needed for pain.  15 tablet  0  . HYDROcodone-acetaminophen (NORCO/VICODIN) 5-325 MG per tablet Take 1 tablet by mouth every 6 (six) hours as needed for pain.  10 tablet  0   No current facility-administered medications for this visit.     No Known Allergies  BP 126/72  Pulse 68  Temp(Src) 97.6 F (36.4 C) (Temporal)  Resp 16  Ht 6' (1.829 m)  Wt 255 lb (115.667 kg)  BMI 34.58 kg/m2  Ct Abdomen Pelvis W Contrast  03/01/2013   *RADIOLOGY REPORT*  Clinical Data: Left abdominal pain  CT ABDOMEN AND PELVIS WITH CONTRAST  Technique:  Multidetector CT imaging of the abdomen and pelvis was performed following the standard protocol during bolus administration of intravenous contrast.  Contrast: 50mL OMNIPAQUE IOHEXOL 300 MG/ML  SOLN, OMNIPAQUE IOHEXOL 300 MG/ML  SOLN  Comparison: 03/19/2011  Findings: Lung bases are unremarkable.  Tiny hiatal hernia.  Sagittal images of the spine are unremarkable.  The liver, spleen, pancreas and adrenal glands are unremarkable.  No calcified gallstones are noted within gallbladder.  Mild atherosclerotic calcifications of the right common iliac artery.  Minimal atherosclerotic calcification in distal abdominal aorta.  No aortic aneurysm.  The kidneys are symmetrical in size and enhancement.  No hydronephrosis or hydroureter.  No small bowel obstruction.  No ascites or free air.  No adenopathy.  There is no pericecal inflammation.  Normal appendix is clearly visualized in axial image 68.  Prostate gland and seminal vesicles are unremarkable.  Small left inguinal scrotal canal hernia containing fat measures 1.7 cm without evidence of acute complication.  IMPRESSION:  1.  No acute inflammatory process within abdomen or pelvis. 2.  No  hydronephrosis or hydroureter. 3.  Normal appendix.  No pericecal inflammation. 4.  Small left inguinal scrotal canal hernia containing fat without evidence of acute complication.   Original Report Authenticated By: Natasha Mead, M.D.     Review of Systems  Constitutional: Negative for fever, chills and diaphoresis.  HENT: Negative for nosebleeds, sore throat, facial swelling, mouth sores, trouble swallowing and ear discharge.   Eyes: Negative for photophobia, discharge and visual disturbance.  Respiratory: Negative for choking, chest tightness, shortness of breath and stridor.   Cardiovascular: Negative for chest pain and palpitations.  Gastrointestinal: Positive for abdominal pain. Negative for nausea, vomiting, diarrhea, constipation, blood in stool, abdominal distention, anal bleeding and rectal pain.  Endocrine: Negative for cold intolerance and heat intolerance.  Genitourinary: Negative for dysuria, urgency, difficulty urinating and testicular pain.  Musculoskeletal: Negative for myalgias, back pain, arthralgias and gait problem.  Skin: Negative for color change, pallor, rash and wound.  Allergic/Immunologic: Negative for environmental allergies and food allergies.  Neurological: Negative for dizziness, speech difficulty, weakness, numbness and headaches.  Hematological: Negative for adenopathy. Does not bruise/bleed easily.  Psychiatric/Behavioral: Negative for hallucinations, confusion and agitation.       Objective:   Physical Exam  Constitutional: He is oriented to person, place, and time. He appears well-developed and well-nourished. No distress.  HENT:  Head: Normocephalic.  Mouth/Throat: Oropharynx is clear and moist. No oropharyngeal exudate.  Eyes: Conjunctivae and EOM are normal. Pupils are equal, round, and reactive to light. No scleral icterus.  Neck: Normal range of motion. Neck supple. No tracheal deviation present.  Cardiovascular: Normal rate, regular rhythm and  intact distal pulses.   Pulmonary/Chest: Effort normal and breath sounds normal. No respiratory distress.  Abdominal: Soft. He exhibits no distension. There is no tenderness. A hernia is present. Hernia confirmed positive in the left inguinal area. Hernia confirmed negative in the right inguinal area.    Musculoskeletal: Normal range of motion. He exhibits no tenderness.  Lymphadenopathy:    He has no cervical adenopathy.       Right: No inguinal adenopathy present.       Left: No inguinal adenopathy present.  Neurological: He is alert and oriented to person, place, and time. No cranial nerve deficit. He exhibits normal muscle tone. Coordination normal.  Skin: Skin is warm and dry. No rash noted. He is not diaphoretic. No erythema. No pallor.  Psychiatric: He has a normal mood and affect. His behavior is normal. Judgment and thought content normal.       Assessment:     Left inguinal hernia in active smoking male     Plan:     Laparoscopic exploration and repair:  The anatomy & physiology of the abdominal wall and pelvic floor was discussed.  The pathophysiology of hernias in the inguinal and pelvic region was discussed.  Natural history risks such as progressive enlargement, pain, incarceration & strangulation was discussed.   Contributors to complications such as smoking, obesity, diabetes, prior surgery, etc were discussed.    I feel the risks of no intervention will lead to serious problems that outweigh the operative risks; therefore, I recommended surgery to reduce and repair the hernia.  I explained laparoscopic techniques with possible need for an open approach.  I noted usual use of mesh to patch and/or buttress hernia repair  Risks such as bleeding, infection, abscess, need for further treatment, heart attack, death, and other risks were discussed.  I noted a good likelihood this will help address the problem.   Goals of post-operative recovery were discussed as well.   Possibility that this will not correct all symptoms was explained.  I stressed the importance of low-impact activity, aggressive pain control, avoiding constipation, & not pushing through pain to minimize risk of post-operative chronic pain or injury. Possibility of reherniation was discussed.  We will work to minimize complications.     An educational handout further explaining the pathology & treatment options was given as well.  Questions were answered.  The patient expresses understanding & wishes to proceed with surgery.  We talked to the patient about the dangers of smoking.  We stressed that tobacco use dramatically increases the risk of peri-operative complications such as infection, tissue necrosis leaving to problems with incision/wound and organ healing, heart attack, stroke, DVT, pulmonary embolism, and death.  We noted there are programs in our community to help stop smoking.

## 2013-03-13 NOTE — Patient Instructions (Addendum)
See the Handout(s) we gave you.  Consider surgery.  Please call our office at [phone] if you wish to schedule surgery or if you have further questions / concerns.   Hernia A hernia occurs when an internal organ pushes out through a weak spot in the abdominal wall. Hernias most commonly occur in the groin and around the navel. Hernias often can be pushed back into place (reduced). Most hernias tend to get worse over time. Some abdominal hernias can get stuck in the opening (irreducible or incarcerated hernia) and cannot be reduced. An irreducible abdominal hernia which is tightly squeezed into the opening is at risk for impaired blood supply (strangulated hernia). A strangulated hernia is a medical emergency. Because of the risk for an irreducible or strangulated hernia, surgery may be recommended to repair a hernia. CAUSES   Heavy lifting.  Prolonged coughing.  Straining to have a bowel movement.  A cut (incision) made during an abdominal surgery. HOME CARE INSTRUCTIONS   Bed rest is not required. You may continue your normal activities.  Avoid lifting more than 10 pounds (4.5 kg) or straining.  Cough gently. If you are a smoker it is best to stop. Even the best hernia repair can break down with the continual strain of coughing. Even if you do not have your hernia repaired, a cough will continue to aggravate the problem.  Do not wear anything tight over your hernia. Do not try to keep it in with an outside bandage or truss. These can damage abdominal contents if they are trapped within the hernia sac.  Eat a normal diet.  Avoid constipation. Straining over long periods of time will increase hernia size and encourage breakdown of repairs. If you cannot do this with diet alone, stool softeners may be used. SEEK IMMEDIATE MEDICAL CARE IF:   You have a fever.  You develop increasing abdominal pain.  You feel nauseous or vomit.  Your hernia is stuck outside the abdomen, looks  discolored, feels hard, or is tender.  You have any changes in your bowel habits or in the hernia that are unusual for you.  You have increased pain or swelling around the hernia.  You cannot push the hernia back in place by applying gentle pressure while lying down. MAKE SURE YOU:   Understand these instructions.  Will watch your condition.  Will get help right away if you are not doing well or get worse. Document Released: 09/14/2005 Document Revised: 12/07/2011 Document Reviewed: 05/03/2008 [facility] Patient Information 2014 [place].  HERNIA REPAIR: POST OP INSTRUCTIONS  1. DIET: Follow a light bland diet the first 24 hours after arrival home, such as soup, liquids, crackers, etc.  Be sure to include lots of fluids daily.  Avoid fast food or heavy meals as your are more likely to get nauseated.  Eat a low fat the next few days after surgery. 2. Take your usually prescribed home medications unless otherwise directed. 3. PAIN CONTROL: a. Pain is best controlled by a usual combination of three different methods TOGETHER: i. Ice/Heat ii. Over the counter pain medication iii. Prescription pain medication b. Most patients will experience some swelling and bruising around the hernia(s) such as the bellybutton, groins, or old incisions.  Ice packs or heating pads (30-60 minutes up to 6 times a day) will help. Use ice for the first few days to help decrease swelling and bruising, then switch to heat to help relax tight/sore spots and speed recovery.  Some people prefer to  use ice alone, heat alone, alternating between ice & heat.  Experiment to what works for you.  Swelling and bruising can take several weeks to resolve.   c. It is helpful to take an over-the-counter pain medication regularly for the first few weeks.  Choose one of the following that works best for you: i. Naproxen (Aleve, etc)  Two 220mg tabs twice a day ii. Ibuprofen (Advil, etc) Three 200mg tabs four times a day  (every meal & bedtime) iii. Acetaminophen (Tylenol, etc) 325-650mg four times a day (every meal & bedtime) d. A  prescription for pain medication should be given to you upon discharge.  Take your pain medication as prescribed.  i. If you are having problems/concerns with the prescription medicine (does not control pain, nausea, vomiting, rash, itching, etc), please call us [phone] to see if we need to switch you to a different pain medicine that will work better for you and/or control your side effect better. ii. If you need a refill on your pain medication, please contact your pharmacy.  They will contact our office to request authorization. Prescriptions will not be filled after 5 pm or on week-ends. 4. Avoid getting constipated.  Between the surgery and the pain medications, it is common to experience some constipation.  Increasing fluid intake and taking a fiber supplement (such as Metamucil, Citrucel, FiberCon, MiraLax, etc) 1-2 times a day regularly will usually help prevent this problem from occurring.  A mild laxative (prune juice, Milk of Magnesia, MiraLax, etc) should be taken according to package directions if there are no bowel movements after 48 hours.   5. Wash / shower every day.  You may shower over the dressings as they are waterproof.   6. Remove your waterproof bandages 5 days after surgery.  You may leave the incision open to air.  You may replace a dressing/Band-Aid to cover the incision for comfort if you wish.  Continue to shower over incision(s) after the dressing is off.    7. ACTIVITIES as tolerated:   a. You may resume regular (light) daily activities beginning the next day-such as daily self-care, walking, climbing stairs-gradually increasing activities as tolerated.  If you can walk 30 minutes without difficulty, it is safe to try more intense activity such as jogging, treadmill, bicycling, low-impact aerobics, swimming, etc. b. Save the most intensive and strenuous  activity for last such as sit-ups, heavy lifting, contact sports, etc  Refrain from any heavy lifting or straining until you are off narcotics for pain control.   c. DO NOT PUSH THROUGH PAIN.  Let pain be your guide: If it hurts to do something, don't do it.  Pain is your body warning you to avoid that activity for another week until the pain goes down. d. You may drive when you are no longer taking prescription pain medication, you can comfortably wear a seatbelt, and you can safely maneuver your car and apply brakes. e. You may have sexual intercourse when it is comfortable.  8. FOLLOW UP in our office a. Please call [facility] at [phone] to set up an appointment to see your surgeon in the office for a follow-up appointment approximately 2-3 weeks after your surgery. b. Make sure that you call for this appointment the day you arrive home to insure a convenient appointment time. 9.  IF YOU HAVE DISABILITY OR FAMILY LEAVE FORMS, BRING THEM TO THE OFFICE FOR PROCESSING.  DO NOT GIVE THEM TO YOUR DOCTOR.  WHEN TO CALL US (  [phone]) [phone]: 1. Poor pain control 2. Reactions / problems with new medications (rash/itching, nausea, etc)  3. Fever over 101.5 F (38.5 C) 4. Inability to urinate 5. Nausea and/or vomiting 6. Worsening swelling or bruising 7. Continued bleeding from incision. 8. Increased pain, redness, or drainage from the incision   The clinic staff is available to answer your questions during regular business hours (8:30am-5pm).  Please don't hesitate to call and ask to speak to one of our nurses for clinical concerns.   If you have a medical emergency, go to the nearest emergency room or call 911.  A surgeon from [facility] is always on call at the hospitals in [facility], [place] [address], [address], [place], [place]  [address] ?  [address] [address], [place], [place]   [address] MAIN: [phone] ? TOLL FREE: [phone] ? FAX: [phone]  [phone] [email or web address]  Managing Pain  Pain after surgery or related to activity is often due to strain/injury to muscle, tendon, nerves and/or incisions.  This pain is usually short-term and will improve in a few months.   Many people find it helpful to do the following things TOGETHER to help speed the process of healing and to get back to regular activity more quickly:  1. Avoid heavy physical activity a.  no lifting greater than 20 pounds b. Do not "push through" the pain.  Listen to your body and avoid positions and maneuvers than reproduce the pain c. Walking is okay as tolerated, but go slowly and stop when getting sore.  d. Remember: If it hurts to do it, then don't do it! 2. Take Anti-inflammatory medication  a. Take with food/snack around the clock for 1-2 weeks i. This helps the muscle and nerve tissues become less irritable and calm down faster b. Choose ONE of the following over-the-counter medications: i. Naproxen 220mg  tabs (ex. Aleve) 1-2 pills twice a day  ii. Ibuprofen 200mg  tabs (ex. Advil, Motrin) 3-4 pills with every meal and just before bedtime iii. Acetaminophen 500mg  tabs (Tylenol) 1-2 pills with every meal and just before bedtime 3. Use a Heating pad or Ice/Cold Pack a. 4-6 times a day b. May use warm bath/hottub  or showers 4. Try Gentle Massage and/or Stretching  a. at the area of pain many times a day b. stop if you feel pain - do not overdo it  Try these steps together to help you body heal faster and avoid making things get worse.  Doing just one of these things may not be enough.    If you are not getting better after two weeks or are noticing you are getting worse, contact our office for further advice; we may need to re-evaluate you & see what other things we can do to help.  We strongly recommend that you stop smoking.  Smoking increases the risk of surgery including infection in the form of an open wound, pus formation, abscess,  hernia at an incision on the abdomen, etc.  You have an increased risk of other MAJOR complications such as stroke, heart attack, forming clots in the leg and/or lungs, and death.    While it can be one of the most difficult things to do, the [place] community has programs to help you stop.  Consider talking with your primary care physician about options.  Also, Smoking Cessation classes are available through the [facility]:  The smoking cessation program is a proven-effective program from the American Lung Association. The  program is available for anyone 81 and older who currently smokes. The program lasts for 7 weeks and is 8 sessions. Each class will be approximately 1 1/2 hours. The program is every Tuesday.  All classes are 12-1:30pm and same location.  Event Location Information:  Location: [facility] 2nd Floor Conference Room 2-037; located next to [facility] cross streets: [facility]ys Damme & [facility] Entrance into the [facility] is adjacent to the [employer] main entrance. The conference room is located on the 2nd floor.  Parking Instructions: Visitor parking is adjacent to [employer] main entrance and the [employer] [phone] or check the Classes and Support Groups   [email or web address] the event of inclemet weather please call [phone] or view online at [email or web address]  Smoking Cessation Quitting smoking is important to your health and has many advantages. However, it is not always easy to quit since nicotine is a very addictive drug. Often times, people try 3 times or more before being able to quit. This document explains the best ways for you to prepare to quit smoking. Quitting takes hard work and a lot of effort, but you can do it. ADVANTAGES OF QUITTING SMOKING  You will live longer, feel better, and live better.  Your body will feel the impact of  quitting smoking almost immediately.  Within 20 minutes, blood pressure decreases. Your pulse returns to its normal level.  After 8 hours, carbon monoxide levels in the blood return to normal. Your oxygen level increases.  After 24 hours, the chance of having a heart attack starts to decrease. Your breath, hair, and body stop smelling like smoke.  After 48 hours, damaged nerve endings begin to recover. Your sense of taste and smell improve.  After 72 hours, the body is virtually free of nicotine. Your bronchial tubes relax and breathing becomes easier.  After 2 to 12 weeks, lungs can hold more air. Exercise becomes easier and circulation improves.  The risk of having a heart attack, stroke, cancer, or lung disease is greatly reduced.  After 1 year, the risk of coronary heart disease is cut in half.  After 5 years, the risk of stroke falls to the same as a nonsmoker.  After 10 years, the risk of lung cancer is cut in half and the risk of other cancers decreases significantly.  After 15 years, the risk of coronary heart disease drops, usually to the level of a nonsmoker.  If you are pregnant, quitting smoking will improve your chances of having a healthy baby.  The people you live with, especially any children, will be healthier.  You will have extra money to spend on things other than cigarettes. QUESTIONS TO THINK ABOUT BEFORE ATTEMPTING TO QUIT You may want to talk about your answers with your caregiver.  Why do you want to quit?  If you tried to quit in the past, what helped and what did not?  What will be the most difficult situations for you after you quit? How will you plan to handle them?  Who can help you through the tough times? Your family? Friends? A caregiver?  What pleasures do you get from smoking? What ways can you still get pleasure if you quit? Here are some questions to ask your caregiver:  How can you help me to be successful at quitting?  What medicine  do you think would be best for me and how should I take  it?  What should I do if I need more help?  What is smoking withdrawal like? How can I get information on withdrawal? GET READY  Set a quit date.  Change your environment by getting rid of all cigarettes, ashtrays, matches, and lighters in your home, car, or work. Do not let people smoke in your home.  Review your past attempts to quit. Think about what worked and what did not. GET SUPPORT AND ENCOURAGEMENT You have a better chance of being successful if you have help. You can get support in many ways.  Tell your family, friends, and co-workers that you are going to quit and need their support. Ask them not to smoke around you.  Get individual, group, or telephone counseling and support. Programs are available at [employer] and health centers. Call your local health department for information about programs in your area.  Spiritual beliefs and practices may help some smokers quit.  Download a "quit meter" on your computer to keep track of quit statistics, such as how long you have gone without smoking, cigarettes not smoked, and money saved.  Get a self-help book about quitting smoking and staying off of tobacco. LEARN NEW SKILLS AND BEHAVIORS  Distract yourself from urges to smoke. Talk to someone, go for a walk, or occupy your time with a task.  Change your normal routine. Take a different route to work. Drink tea instead of coffee. Eat breakfast in a different place.  Reduce your stress. Take a hot bath, exercise, or read a book.  Plan something enjoyable to do every day. Reward yourself for not smoking.  Explore interactive web-based programs that specialize in helping you quit. GET MEDICINE AND USE IT CORRECTLY Medicines can help you stop smoking and decrease the urge to smoke. Combining medicine with the above behavioral methods and support can greatly increase your chances of successfully quitting  smoking.  Nicotine replacement therapy helps deliver nicotine to your body without the negative effects and risks of smoking. Nicotine replacement therapy includes nicotine gum, lozenges, inhalers, nasal sprays, and skin patches. Some may be available over-the-counter and others require a prescription.  Antidepressant medicine helps people abstain from smoking, but how this works is unknown. This medicine is available by prescription.  Nicotinic receptor partial agonist medicine simulates the effect of nicotine in your brain. This medicine is available by prescription. Ask your caregiver for advice about which medicines to use and how to use them based on your health history. Your caregiver will tell you what side effects to look out for if you choose to be on a medicine or therapy. Carefully read the information on the package. Do not use any other product containing nicotine while using a nicotine replacement product.  RELAPSE OR DIFFICULT SITUATIONS Most relapses occur within the first 3 months after quitting. Do not be discouraged if you start smoking again. Remember, most people try several times before finally quitting. You may have symptoms of withdrawal because your body is used to nicotine. You may crave cigarettes, be irritable, feel very hungry, cough often, get headaches, or have difficulty concentrating. The withdrawal symptoms are only temporary. They are strongest when you first quit, but they will go away within 10 14 days. To reduce the chances of relapse, try to:  Avoid drinking alcohol. Drinking lowers your chances of successfully quitting.  Reduce the amount of caffeine you consume. Once you quit smoking, the amount of caffeine in your body increases and can give you symptoms, such as  a rapid heartbeat, sweating, and anxiety.  Avoid smokers because they can make you want to smoke.  Do not let weight gain distract you. Many smokers will gain weight when they quit, usually less  than 10 pounds. Eat a healthy diet and stay active. You can always lose the weight gained after you quit.  Find ways to improve your mood other than smoking. FOR MORE INFORMATION  www.smokefree.gov  Document Released: 09/08/2001 Document Revised: 03/15/2012 Document Reviewed: 12/24/2011 [facility] Patient Information 2014 [place], [place].

## 2013-03-17 ENCOUNTER — Encounter (INDEPENDENT_AMBULATORY_CARE_PROVIDER_SITE_OTHER): Payer: Self-pay

## 2015-04-02 ENCOUNTER — Encounter (HOSPITAL_COMMUNITY): Payer: Self-pay | Admitting: *Deleted

## 2015-04-02 ENCOUNTER — Emergency Department (HOSPITAL_COMMUNITY)
Admission: EM | Admit: 2015-04-02 | Discharge: 2015-04-02 | Disposition: A | Discharge status: Home / Self Care | Payer: Self-pay | Attending: Emergency Medicine | Admitting: Emergency Medicine

## 2015-04-02 DIAGNOSIS — Z720 Tobacco use: Secondary | ICD-10-CM | POA: Insufficient documentation

## 2015-04-02 DIAGNOSIS — L259 Unspecified contact dermatitis, unspecified cause: Secondary | ICD-10-CM | POA: Insufficient documentation

## 2015-04-02 MED ORDER — LORATADINE 10 MG PO TABS: 10.0000 mg | ORAL_TABLET | Freq: Every day | ORAL | Status: AC

## 2015-04-02 MED ORDER — METHYLPREDNISOLONE SODIUM SUCC 125 MG IJ SOLR: 80.0000 mg | Freq: Once | INTRAMUSCULAR | Status: DC

## 2015-04-02 MED ORDER — HYDROXYZINE HCL 25 MG PO TABS: 25.0000 mg | ORAL_TABLET | Freq: Three times a day (TID) | ORAL | Status: AC | PRN

## 2015-04-02 MED ORDER — PREDNISONE 10 MG (21) PO TBPK: 10.0000 mg | ORAL_TABLET | Freq: Every day | ORAL | Status: DC

## 2015-04-02 MED ORDER — METHYLPREDNISOLONE SODIUM SUCC 125 MG IJ SOLR
80.0000 mg | Freq: Once | INTRAMUSCULAR | Status: AC
  Administered 2015-04-02: 80 mg via INTRAMUSCULAR
  Filled 2015-04-02: qty 2

## 2015-04-02 NOTE — ED Notes (Signed)
Pt states that he has had a rash with blisters to his lower legs for about 1 week. Pt states that he has pain at the site and when he walks.

## 2015-04-02 NOTE — Discharge Instructions (Signed)
Use Calamine Lotion to help dry the rash. Follow up with Dr. Margo AyeHall if symptoms worsen.

## 2015-04-02 NOTE — ED Provider Notes (Signed)
CSN: [id number]     Arrival date & time 04/02/15  1853 History  This chart was scribed for non-physician practitioner Reginald BuffaloHope Hiep Ollis, NP working with Reginald MochaBlair Walden, MD by Reginald Castillo, ED Scribe. This patient was seen in room TR06C/TR06C and the patient's care was started at 7:18 PM.    Chief Complaint  Patient presents with  . Rash   Patient is a 34 y.o. male presenting with rash. The history is provided by the patient. No language interpreter was used.  Rash Location:  Leg Leg rash location:  L lower leg, R lower leg, L ankle and R ankle Quality: blistering, itchiness, painful and redness   Pain details:    Quality:  Stinging   Severity:  Moderate   Onset quality:  Gradual   Duration:  8 days   Timing:  Constant   Progression:  Worsening Severity:  Moderate Onset quality:  Gradual Duration:  8 days Timing:  Constant Progression:  Worsening Chronicity:  New Context: plant contact   Relieved by:  Nothing Worsened by:  Contact Ineffective treatments:  Antihistamines Associated symptoms: no fever    HPI Comments: Reginald Castillo is a 34 y.o. male, with no pertinent PMhx, who presents to the Emergency Department complaining of a gradually spreading, pruritic, painful, erythematous rash consistent with watery blisters upon onset that began 8 days ago. The rash is located on pt's bilateral ankles and has spread up his bilateral inner lower legs since onset. He notes the pain is exacerbated when he stands and describes it as a stinging pain. Pt states he mowed the yard 9 days ago and noticed the rash appear the next morning. Pt has used hydrogen peroxide and taken benadryl with no relief. Denies fevers.   History reviewed. No pertinent past medical history. Past Surgical History  Procedure Laterality Date  . Wrist surgery     Family History  Problem Relation Age of Onset  . Cancer Mother     breast  . Cancer Maternal Grandmother     breast  . Cancer Maternal Grandfather      prostate  . Cancer Paternal Grandfather     prostate   History  Substance Use Topics  . Smoking status: Current Every Day Smoker -- 0.50 packs/day  . Smokeless tobacco: Never Used  . Alcohol Use: No    Review of Systems  Constitutional: Negative for fever.  Skin: Positive for color change and rash.  All other systems reviewed and are negative.  Allergies  Review of patient's allergies indicates no known allergies.  Home Medications   Prior to Admission medications   Medication Sig Start Date End Date Taking? Authorizing Provider  HYDROcodone-acetaminophen (NORCO) 5-325 MG per tablet Take 2 tablets by mouth every 4 (four) hours as needed for pain. 03/06/13   Wayland SalinasJohn Bednar, MD  HYDROcodone-acetaminophen (NORCO/VICODIN) 5-325 MG per tablet Take 1 tablet by mouth every 6 (six) hours as needed for pain. 03/01/13   Gerhard Munchobert Lockwood, MD  hydrOXYzine (ATARAX/VISTARIL) 25 MG tablet Take 1 tablet (25 mg total) by mouth every 8 (eight) hours as needed. 04/02/15   [name] [name], NP  loratadine (CLARITIN) 10 MG tablet Take 1 tablet (10 mg total) by mouth daily. 04/02/15   [name] Orlene OchM Yolando Gillum, NP  predniSONE (STERAPRED UNI-PAK 21 TAB) 10 MG (21) TBPK tablet Take 1 tablet (10 mg total) by mouth daily. Starting tomorrow take 6 tablets PO then 5, 4, 3, 2, 1 04/02/15   [name] Orlene OchM Adante Courington, NP   BP  166/81 mmHg  Pulse 82  Temp(Src) 98.2 F (36.8 C) (Oral)  Resp 16  SpO2 99% Physical Exam  Constitutional: He is oriented to person, place, and time. He appears well-developed and well-nourished.  HENT:  Head: Normocephalic.  Eyes: EOM are normal.  Neck: Neck supple.  Cardiovascular: Normal rate.   Bilateral pedal pulses 2+, adequate circulation.   Pulmonary/Chest: Effort normal.  Abdominal: Soft. There is no tenderness.  Musculoskeletal: Normal range of motion.  Rash bilateral lower legs.  Neurological: He is alert and oriented to person, place, and time. No cranial nerve deficit.  Skin: Skin is warm and dry.   Vesicular lesions that are linear; consistent with allergic contact dermatitis.   Psychiatric: He has a normal mood and affect. His behavior is normal.  Nursing note and vitals reviewed.   ED Course  Procedures  DIAGNOSTIC STUDIES: Oxygen Saturation is 99% on RA, normal by my interpretation.    COORDINATION OF CARE: 7:22 PM Discussed treatment plan which includes to order and prescribe prednisone , Claritin , and Pepcid with pt. Pt acknowledges and agrees to plan.    MDM  34 y.o. male with itching and rash to bilateral lower legs after exposure to plants, poison ivy while mowing the grass. Stable for d/c without respiratory problems 02 SAT 99% on R/A. No signs of infection of the skin. Discussed with the patient and all questioned fully answered. He will return here or follow up with dermatology if any problems arise.   Final diagnoses:  Contact dermatitis   I personally performed the services described in this documentation, which was scribed in my presence. The recorded information has been reviewed and is accurate.      [address] [place], NP 04/04/15 0013  Reginald Mocha, MD 04/05/15 [phone]

## 2015-05-07 ENCOUNTER — Emergency Department (HOSPITAL_COMMUNITY): Payer: Self-pay

## 2015-05-07 ENCOUNTER — Emergency Department (HOSPITAL_COMMUNITY)
Admission: EM | Admit: 2015-05-07 | Discharge: 2015-05-07 | Disposition: A | Discharge status: Home / Self Care | Payer: Self-pay | Attending: Emergency Medicine | Admitting: Emergency Medicine

## 2015-05-07 ENCOUNTER — Encounter (HOSPITAL_COMMUNITY): Payer: Self-pay | Admitting: Nurse Practitioner

## 2015-05-07 DIAGNOSIS — Z720 Tobacco use: Secondary | ICD-10-CM | POA: Insufficient documentation

## 2015-05-07 DIAGNOSIS — S60211A Contusion of right wrist, initial encounter: Secondary | ICD-10-CM | POA: Insufficient documentation

## 2015-05-07 DIAGNOSIS — Y990 Civilian activity done for income or pay: Secondary | ICD-10-CM | POA: Insufficient documentation

## 2015-05-07 DIAGNOSIS — Z79899 Other long term (current) drug therapy: Secondary | ICD-10-CM | POA: Insufficient documentation

## 2015-05-07 DIAGNOSIS — Y9289 Other specified places as the place of occurrence of the external cause: Secondary | ICD-10-CM | POA: Insufficient documentation

## 2015-05-07 DIAGNOSIS — W311XXA Contact with metalworking machines, initial encounter: Secondary | ICD-10-CM | POA: Insufficient documentation

## 2015-05-07 DIAGNOSIS — Y9389 Activity, other specified: Secondary | ICD-10-CM | POA: Insufficient documentation

## 2015-05-07 MED ORDER — NAPROXEN 500 MG PO TABS: 500.0000 mg | ORAL_TABLET | Freq: Two times a day (BID) | ORAL | Status: AC

## 2015-05-07 MED ORDER — HYDROCODONE-ACETAMINOPHEN 5-325 MG PO TABS: 1.0000 | ORAL_TABLET | Freq: Four times a day (QID) | ORAL | Status: DC | PRN

## 2015-05-07 NOTE — ED Provider Notes (Signed)
CSN: [id number]     Arrival date & time 05/07/15  1524 History  This chart was scribed for non-physician practitioner, Kerrie Buffalo, NP working with No att. providers found by Gwenyth Ober, ED scribe. This patient was seen in room TR02C/TR02C and the patient's care was started at 4:05 PM    Chief Complaint  Patient presents with  . Wrist Pain   Patient is a 34 y.o. male presenting with wrist pain. The history is provided by the patient. No language interpreter was used.  Wrist Pain This is a new problem. The current episode started yesterday. The problem occurs constantly. The problem has not changed since onset.Pertinent negatives include no chest pain, no abdominal pain, no headaches and no shortness of breath. The symptoms are aggravated by twisting. Nothing relieves the symptoms.    HPI Comments: Reginald Castillo is a 34 y.o. male who presents to the Emergency Department complaining of constant, moderate right wrist pain that started yesterday. Pt states swelling of his right wrist as an associated symptom. His pain becomes worse with gripping and movement. Pt reports injury occurred when he was drilling through sheet metal yesterday and the drill "kicked back" and hit him. He also notes feeling a pop and a crunch when he turned his key in the car ignition with the affected wrist this morning. Pt reports two prior fractures and surgeries to the same wrist. He denies numbness and forearm pain.   History reviewed. No pertinent past medical history. Past Surgical History  Procedure Laterality Date  . Wrist surgery     Family History  Problem Relation Age of Onset  . Cancer Mother     breast  . Cancer Maternal Grandmother     breast  . Cancer Maternal Grandfather     prostate  . Cancer Paternal Grandfather     prostate   History  Substance Use Topics  . Smoking status: Current Every Day Smoker -- 0.50 packs/day  . Smokeless tobacco: Never Used  . Alcohol Use: No    Review of  Systems  Respiratory: Negative for shortness of breath.   Cardiovascular: Negative for chest pain.  Gastrointestinal: Negative for abdominal pain.  Musculoskeletal: Positive for joint swelling and arthralgias.       Right wrist pain  Skin: Negative for wound.  Neurological: Negative for numbness and headaches.  All other systems reviewed and are negative.     Allergies  Review of patient's allergies indicates no known allergies.  Home Medications   Prior to Admission medications   Medication Sig Start Date End Date Taking? Authorizing Provider  HYDROcodone-acetaminophen (NORCO) 5-325 MG per tablet Take 1 tablet by mouth every 6 (six) hours as needed. 05/07/15   Hope Orlene Och, NP  hydrOXYzine (ATARAX/VISTARIL) 25 MG tablet Take 1 tablet (25 mg total) by mouth every 8 (eight) hours as needed. 04/02/15   Hope Orlene Och, NP  loratadine (CLARITIN) 10 MG tablet Take 1 tablet (10 mg total) by mouth daily. 04/02/15   Hope Orlene Och, NP  naproxen (NAPROSYN) 500 MG tablet Take 1 tablet (500 mg total) by mouth 2 (two) times daily. 05/07/15   Hope Orlene Och, NP  predniSONE (STERAPRED UNI-PAK 21 TAB) 10 MG (21) TBPK tablet Take 1 tablet (10 mg total) by mouth daily. Starting tomorrow take 6 tablets PO then 5, 4, 3, 2, 1 04/02/15   Hope M Neese, NP   BP 142/83 mmHg  Pulse 97  Temp(Src) 98.4 F (36.9 C) (Oral)  Resp  16  SpO2 98% Physical Exam  Constitutional: He appears well-developed and well-nourished. No distress.  HENT:  Head: Normocephalic and atraumatic.  Eyes: Conjunctivae and EOM are normal.  Neck: Neck supple. No tracheal deviation present.  Cardiovascular: Normal rate.   Pulses:      Radial pulses are 2+ on the right side, and 2+ on the left side.  Pulmonary/Chest: Effort normal. No respiratory distress.  Musculoskeletal: He exhibits tenderness.       Right wrist: He exhibits tenderness. He exhibits normal range of motion, no deformity and no laceration. Swelling: minimal.  Right wrist:  Adequate circulation, pain with flexion and extension, swelling to the dorsum of the wrist. Tenderness on ROM No right forearm pain  Neurological:  Good touch and sensation  Skin: Skin is warm and dry.  Psychiatric: He has a normal mood and affect. His behavior is normal.  Nursing note and vitals reviewed.   ED Course  Procedures   DIAGNOSTIC STUDIES: Oxygen Saturation is 98% on RA, normal by my interpretation.    COORDINATION OF CARE: 4:10 PM Discussed treatment plan with pt at bedside and pt agreed to plan.   Labs Review Labs Reviewed - No data to display  Imaging Review Dg Wrist Complete Right  05/07/2015   CLINICAL DATA:  RIGHT wrist pain. Medial and lateral wrist pain. Multiple prior operations.  EXAM: RIGHT WRIST - COMPLETE 3+ VIEW  COMPARISON:  None.  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 No displaced scaphoid waist fracture is identified. Deformity is present in the distal scaphoid, likely representing healed scaphoid fracture. Lucency is present in the lunate bone, probably representing a subchondral cyst associated with degenerative change from the fragmented proximal scaphoid pole and scapholunate articulation. Ulnar minus variance is present.  There is no acute fracture. 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 Carpal boss is incidentally noted on the lateral projection.  IMPRESSION: No acute osseous abnormality. 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   Electronically Signed   By: Andreas Newport M.D.   On: 05/07/2015 16:16     MDM  .34 y.o. male with right wrist pain s/p injury while at work yesterday. Stable for d/c without neurovascular deficits. Wrist splint applied, ice, elevation, pain management. He will follow up with Dr. Merrilee Seashore office (where he had previous care) if symptoms persist. Discussed with the  patient and all questioned fully answered. He will return here if any problems arise.   Final diagnoses:  Wrist contusion, right, initial encounter    I personally performed the services described in this documentation, which was scribed in my presence. The recorded information has been reviewed and is accurate.   [address] [place], [place] 05/07/15 1639  Cathren Laine, MD 05/08/15 [phone]

## 2015-05-07 NOTE — ED Notes (Signed)
He was using drill yesterday and the drill "kicked back" while he was using it. He's had R wrist pain since and felt "crunching" in the wrist this morning when turning the keys in the ignition. Pulses intact, skin w/d. He reports hx fracture in this wrist

## 2015-05-07 NOTE — Discharge Instructions (Signed)
Contusion °A contusion is a deep bruise. Contusions happen when an injury causes bleeding under the skin. Signs of bruising include pain, puffiness (swelling), and discolored skin. The contusion may turn blue, purple, or yellow. °HOME CARE  °· Put ice on the injured area. °¨ Put ice in a plastic bag. °¨ Place a towel between your skin and the bag. °¨ Leave the ice on for 15-20 minutes, 03-04 times a day. °· Only take medicine as told by your doctor. °· Rest the injured area. °· If possible, raise (elevate) the injured area to lessen puffiness. °GET HELP RIGHT AWAY IF:  °· You have more bruising or puffiness. °· You have pain that is getting worse. °· Your puffiness or pain is not helped by medicine. °MAKE SURE YOU:  °· Understand these instructions. °· Will watch your condition. °· Will get help right away if you are not doing well or get worse. °Document Released: 03/02/2008 Document Revised: 12/07/2011 Document Reviewed: 07/20/2011 °[employer]® Patient Information ©2015 [employer]. This information is not intended to replace advice given to you by your health care provider. Make sure you discuss any questions you have with your health care provider. ° °

## 2017-03-06 DIAGNOSIS — F17200 Nicotine dependence, unspecified, uncomplicated: Secondary | ICD-10-CM | POA: Insufficient documentation

## 2017-03-06 DIAGNOSIS — W1842XA Slipping, tripping and stumbling without falling due to stepping into hole or opening, initial encounter: Secondary | ICD-10-CM | POA: Insufficient documentation

## 2017-03-06 DIAGNOSIS — Y999 Unspecified external cause status: Secondary | ICD-10-CM | POA: Insufficient documentation

## 2017-03-06 DIAGNOSIS — Y9301 Activity, walking, marching and hiking: Secondary | ICD-10-CM | POA: Insufficient documentation

## 2017-03-06 DIAGNOSIS — Y929 Unspecified place or not applicable: Secondary | ICD-10-CM | POA: Insufficient documentation

## 2017-03-06 DIAGNOSIS — Z79899 Other long term (current) drug therapy: Secondary | ICD-10-CM | POA: Insufficient documentation

## 2017-03-06 DIAGNOSIS — M25571 Pain in right ankle and joints of right foot: Secondary | ICD-10-CM | POA: Insufficient documentation

## 2017-03-07 ENCOUNTER — Emergency Department (HOSPITAL_COMMUNITY)
Admission: EM | Admit: 2017-03-07 | Discharge: 2017-03-07 | Disposition: A | Discharge status: Home / Self Care | Payer: Self-pay | Attending: Emergency Medicine | Admitting: Emergency Medicine

## 2017-03-07 ENCOUNTER — Encounter (HOSPITAL_COMMUNITY): Payer: Self-pay | Admitting: Emergency Medicine

## 2017-03-07 ENCOUNTER — Emergency Department (HOSPITAL_COMMUNITY): Payer: Self-pay

## 2017-03-07 DIAGNOSIS — M25571 Pain in right ankle and joints of right foot: Secondary | ICD-10-CM

## 2017-03-07 MED ORDER — HYDROCODONE-ACETAMINOPHEN 5-325 MG PO TABS
1.0000 | ORAL_TABLET | Freq: Once | ORAL | Status: AC
  Administered 2017-03-07: 1 via ORAL
  Filled 2017-03-07: qty 1

## 2017-03-07 MED ORDER — TRAMADOL HCL 50 MG PO TABS: 50.0000 mg | ORAL_TABLET | Freq: Four times a day (QID) | ORAL | 0 refills | Status: AC | PRN

## 2017-03-07 MED ORDER — IBUPROFEN 800 MG PO TABS: 800.0000 mg | ORAL_TABLET | Freq: Three times a day (TID) | ORAL | 0 refills | Status: AC | PRN

## 2017-03-07 MED ORDER — IBUPROFEN 800 MG PO TABS
800.0000 mg | ORAL_TABLET | Freq: Once | ORAL | Status: AC
  Administered 2017-03-07: 800 mg via ORAL
  Filled 2017-03-07: qty 1

## 2017-03-07 NOTE — ED Provider Notes (Signed)
[facility]-EMERGENCY DEPT Provider Note   CSN: [id number] Arrival date & time: 03/06/17  2345     History   Chief Complaint Chief Complaint  Patient presents with  . Ankle Pain    HPI Reginald Castillo is a 36 y.o. male.  HPI Patient presents to the emergency department with right ankle injury that occurred yesterday.  The patient states he was walking and stepped in a hole and twisted his ankle.  The patient states that he has trouble bearing weight due to the pain.  He should states he did not take any medications prior to arrival for symptoms.  Patient denies numbness, weakness, back pain, neck pain or loss of consciousness.  Patient did not try any other therapy such as ice or elevation History reviewed. No pertinent past medical history.  Patient Active Problem List   Diagnosis Date Noted  . Inguinal hernia, left 03/13/2013    Past Surgical History:  Procedure Laterality Date  . WRIST SURGERY         Home Medications    Prior to Admission medications   Medication Sig Start Date End Date Taking? Authorizing Provider  HYDROcodone-acetaminophen (NORCO) 5-325 MG per tablet Take 1 tablet by mouth every 6 (six) hours as needed. 05/07/15   Janne NapoleonNeese, Hope M, NP  hydrOXYzine (ATARAX/VISTARIL) 25 MG tablet Take 1 tablet (25 mg total) by mouth every 8 (eight) hours as needed. 04/02/15   Janne NapoleonNeese, Hope M, NP  loratadine (CLARITIN) 10 MG tablet Take 1 tablet (10 mg total) by mouth daily. 04/02/15   Janne NapoleonNeese, Hope M, NP  naproxen (NAPROSYN) 500 MG tablet Take 1 tablet (500 mg total) by mouth 2 (two) times daily. 05/07/15   Janne NapoleonNeese, Hope M, NP  predniSONE (STERAPRED UNI-PAK 21 TAB) 10 MG (21) TBPK tablet Take 1 tablet (10 mg total) by mouth daily. Starting tomorrow take 6 tablets PO then 5, 4, 3, 2, 1 04/02/15   Janne NapoleonNeese, Hope M, NP    Family History Family History  Problem Relation Age of Onset  . Cancer Mother        breast  . Cancer Maternal Grandmother        breast  . Cancer Maternal Grandfather       prostate  . Cancer Paternal Grandfather        prostate    Social History Social History  Substance Use Topics  . Smoking status: Current Every Day Smoker    Packs/day: 0.50  . Smokeless tobacco: Never Used  . Alcohol use No     Allergies   Patient has no known allergies.   Review of Systems Review of Systems All other systems negative except as documented in the HPI. All pertinent positives and negatives as reviewed in the HPI.  Physical Exam Updated Vital Signs BP (!) 153/76 (BP Location: Left Arm)   Pulse 79   Temp 98 F (36.7 C) (Oral)   Resp 16   Ht 6' (1.829 m)   Wt 127 kg (280 lb)   SpO2 98%   BMI 37.97 kg/m   Physical Exam  Constitutional: He is oriented to person, place, and time. He appears well-developed and well-nourished. No distress.  HENT:  Head: Normocephalic and atraumatic.  Eyes: Pupils are equal, round, and reactive to light.  Pulmonary/Chest: Effort normal.  Musculoskeletal:       Right ankle: He exhibits decreased range of motion, swelling and ecchymosis. He exhibits no deformity. Tenderness. Lateral malleolus, medial malleolus, AITFL and CF ligament tenderness found.  No head of 5th metatarsal and no proximal fibula tenderness found. Achilles tendon normal.  Neurological: He is alert and oriented to person, place, and time.  Skin: Skin is warm and dry.  Psychiatric: He has a normal mood and affect.  Nursing note and vitals reviewed.    ED Treatments / Results  Labs (all labs ordered are listed, but only abnormal results are displayed) Labs Reviewed - No data to display  EKG  EKG Interpretation None       Radiology Dg Ankle Complete Right  Result Date: 03/07/2017 CLINICAL DATA:  Right ankle pain. Stepped in a hole yesterday rolling ankle. EXAM: RIGHT ANKLE - COMPLETE 3+ VIEW COMPARISON:  None. FINDINGS: There is no evidence of fracture or dislocation. There is no evidence of arthropathy or other focal bone abnormality. Small  well corticated density distal to the fibular tip may be all sequela sequela of remote prior injury. The ankle mortise is preserved. Small tibiotalar joint effusion. Diffuse soft tissue edema. IMPRESSION: No fracture or subluxation. Small tibial talar joint effusion and diffuse soft tissue edema. Electronically Signed   By: Rubye Oaks M.D.   On: 03/07/2017 01:08    Procedures Procedures (including critical care time)  Medications Ordered in ED Medications  ibuprofen (ADVIL,MOTRIN) tablet 800 mg (800 mg Oral Given 03/07/17 0333)  HYDROcodone-acetaminophen (NORCO/VICODIN) 5-325 MG per tablet 1 tablet (1 tablet Oral Given 03/07/17 0333)     Initial Impression / Assessment and Plan / ED Course  I have reviewed the triage vital signs and the nursing notes.  Pertinent labs & imaging results that were available during my care of the patient were reviewed by me and considered in my medical decision making (see chart for details).    Patient be placed in a cam walker and crutches.  Told ice and elevate the area.  Told to follow up with orthopedics.  Patient agrees the plan and all questions were answered  Final Clinical Impressions(s) / ED Diagnoses   Final diagnoses:  None    New Prescriptions New Prescriptions   No medications on file     Charlestine Night, Cordelia Poche 03/07/17 1610    Devoria Albe, MD 03/07/17 [phone]

## 2017-03-07 NOTE — ED Triage Notes (Signed)
Pt while work at [employer] twisted ankle 1 day ago slight pain at that time that has worsen to 8/10 ibuprofen ineffective

## 2017-03-07 NOTE — Discharge Instructions (Signed)
Ice and elevate your ankle.  Return here as needed.  The x-rays did not show any fractures at this point.  Follow-up with the orthopedist provided

## 2018-06-21 ENCOUNTER — Other Ambulatory Visit: Payer: Self-pay

## 2018-06-21 ENCOUNTER — Encounter (HOSPITAL_COMMUNITY): Payer: Self-pay

## 2018-06-21 ENCOUNTER — Emergency Department (HOSPITAL_COMMUNITY): Payer: BLUE CROSS/BLUE SHIELD

## 2018-06-21 ENCOUNTER — Emergency Department (HOSPITAL_COMMUNITY)
Admission: EM | Admit: 2018-06-21 | Discharge: 2018-06-21 | Disposition: A | Discharge status: Home / Self Care | Payer: BLUE CROSS/BLUE SHIELD | Attending: Emergency Medicine | Admitting: Emergency Medicine

## 2018-06-21 DIAGNOSIS — F17210 Nicotine dependence, cigarettes, uncomplicated: Secondary | ICD-10-CM | POA: Insufficient documentation

## 2018-06-21 DIAGNOSIS — K219 Gastro-esophageal reflux disease without esophagitis: Secondary | ICD-10-CM | POA: Insufficient documentation

## 2018-06-21 DIAGNOSIS — R0789 Other chest pain: Secondary | ICD-10-CM | POA: Diagnosis not present

## 2018-06-21 LAB — I-STAT TROPONIN, ED
TROPONIN I, POC: 0 ng/mL (ref 0.00–0.08)
Troponin i, poc: 0 ng/mL (ref 0.00–0.08)

## 2018-06-21 LAB — CBC
HEMATOCRIT: 51.2 % (ref 39.0–52.0)
Hemoglobin: 16.8 g/dL (ref 13.0–17.0)
MCH: 29.2 pg (ref 26.0–34.0)
MCHC: 32.8 g/dL (ref 30.0–36.0)
MCV: 89 fL (ref 78.0–100.0)
Platelets: 380 10*3/uL (ref 150–400)
RBC: 5.75 MIL/uL (ref 4.22–5.81)
RDW: 14.2 % (ref 11.5–15.5)
WBC: 14.5 10*3/uL — ABNORMAL HIGH (ref 4.0–10.5)

## 2018-06-21 LAB — BASIC METABOLIC PANEL
Anion gap: 13 (ref 5–15)
BUN: 13 mg/dL (ref 6–20)
CHLORIDE: 106 mmol/L (ref 98–111)
CO2: 19 mmol/L — ABNORMAL LOW (ref 22–32)
Calcium: 9.5 mg/dL (ref 8.9–10.3)
Creatinine, Ser: 0.93 mg/dL (ref 0.61–1.24)
GFR calc Af Amer: >60 mL/min (ref >60)
GFR calc non Af Amer: >60 mL/min (ref >60)
Glucose, Bld: 111 mg/dL — ABNORMAL HIGH (ref 70–99)
POTASSIUM: 4.1 mmol/L (ref 3.5–5.1)
Sodium: 138 mmol/L (ref 135–145)

## 2018-06-21 MED ORDER — ALUM & MAG HYDROXIDE-SIMETH 200-200-20 MG/5ML PO SUSP
15.0000 mL | Freq: Once | ORAL | Status: AC
  Administered 2018-06-21: 15 mL via ORAL
  Filled 2018-06-21: qty 30

## 2018-06-21 MED ORDER — RANITIDINE HCL 150 MG/10ML PO SYRP
150.0000 mg | ORAL_SOLUTION | Freq: Once | ORAL | Status: AC
  Administered 2018-06-21: 150 mg via ORAL
  Filled 2018-06-21: qty 10

## 2018-06-21 MED ORDER — ACETAMINOPHEN 325 MG PO TABS
650.0000 mg | ORAL_TABLET | Freq: Once | ORAL | Status: AC
  Administered 2018-06-21: 650 mg via ORAL
  Filled 2018-06-21: qty 2

## 2018-06-21 NOTE — ED Provider Notes (Addendum)
[facility]   CSN: [id number] Arrival date & time: 06/21/18  1042     History   Chief Complaint Chief Complaint  Patient presents with  . Chest Pain    HPI Reginald Castillo is a 37 y.o. male.  HPI   Patient is a 37yo male with no PMHx who presents with sharp central CP with radiation to the back x 30 min today while driving.  Similar pain has been intermittent over the last 2 weeks and last roughly 15-5630min.  Not exertional, reproducible, or pleuritic.  He was given full dose ASA and nitro PTA by EMS with some improvement.  Pain is currently rated at a 2/10 in intensity.  He c/o associated diaphoresis and nausea however denies SOB, cough, fevers, abdominal pain, vomiting, HA, and neck pain.   Of note, mother with significant cardiac disease in her 30s.  Patient is a current everyday smoker.  No drug or EtOH use.  No trauma or falls.  History reviewed. No pertinent past medical history.  Patient Active Problem List   Diagnosis Date Noted  . Inguinal hernia, left 03/13/2013    Past Surgical History:  Procedure Laterality Date  . WRIST SURGERY          Home Medications    Prior to Admission medications   Medication Sig Start Date End Date Taking? Authorizing Provider  HYDROcodone-acetaminophen (NORCO) 5-325 MG per tablet Take 1 tablet by mouth every 6 (six) hours as needed. Patient not taking: Reported on 06/21/2018 05/07/15   Janne NapoleonNeese, Hope M, NP  hydrOXYzine (ATARAX/VISTARIL) 25 MG tablet Take 1 tablet (25 mg total) by mouth every 8 (eight) hours as needed. Patient not taking: Reported on 06/21/2018 04/02/15   Janne NapoleonNeese, Hope M, NP  ibuprofen (ADVIL,MOTRIN) 800 MG tablet Take 1 tablet (800 mg total) by mouth every 8 (eight) hours as needed. Patient not taking: Reported on 06/21/2018 03/07/17   Charlestine NightLawyer, Christopher, PA-C  loratadine (CLARITIN) 10 MG tablet Take 1 tablet (10 mg total) by mouth daily. Patient not taking: Reported on  06/21/2018 04/02/15   Janne NapoleonNeese, Hope M, NP  naproxen (NAPROSYN) 500 MG tablet Take 1 tablet (500 mg total) by mouth 2 (two) times daily. Patient not taking: Reported on 06/21/2018 05/07/15   Janne NapoleonNeese, Hope M, NP  predniSONE (STERAPRED UNI-PAK 21 TAB) 10 MG (21) TBPK tablet Take 1 tablet (10 mg total) by mouth daily. Starting tomorrow take 6 tablets PO then 5, 4, 3, 2, 1 Patient not taking: Reported on 06/21/2018 04/02/15   Janne NapoleonNeese, Hope M, NP  traMADol (ULTRAM) 50 MG tablet Take 1 tablet (50 mg total) by mouth every 6 (six) hours as needed for severe pain. Patient not taking: Reported on 06/21/2018 03/07/17   Charlestine NightLawyer, Christopher, PA-C    Family History Family History  Problem Relation Age of Onset  . Cancer Mother        breast  . Cancer Maternal Grandmother        breast  . Cancer Maternal Grandfather        prostate  . Cancer Paternal Grandfather        prostate    Social History Social History   Tobacco Use  . Smoking status: Current Every Day Smoker    Packs/day: 0.50  . Smokeless tobacco: Never Used  Substance Use Topics  . Alcohol use: No  . Drug use: Yes    Types: Marijuana     Allergies   Patient has no  known allergies.   Review of Systems Review of Systems  Constitutional: Positive for diaphoresis. Negative for chills and fever.  HENT: Negative for ear pain and sore throat.   Eyes: Negative for pain and visual disturbance.  Respiratory: Negative for cough and shortness of breath.   Cardiovascular: Positive for chest pain. Negative for palpitations.  Gastrointestinal: Positive for nausea. Negative for abdominal pain, diarrhea and vomiting.  Genitourinary: Negative for dysuria and hematuria.  Musculoskeletal: Negative for arthralgias and back pain.  Skin: Negative for color change and rash.  Neurological: Negative for seizures and syncope.  All other systems reviewed and are negative.    Physical Exam Updated Vital Signs BP (!) 146/94 (BP Location: Left Arm)   Pulse 76    Temp 97.6 F (36.4 C) (Oral)   Resp 18   Ht 6' (1.829 m)   Wt 124.7 kg   SpO2 98%   BMI 37.30 kg/m   Physical Exam  Constitutional: He appears well-developed and well-nourished. He does not appear ill. No distress.  HENT:  Head: Normocephalic and atraumatic.  Eyes: Pupils are equal, round, and reactive to light. Conjunctivae and EOM are normal.  Neck: Normal range of motion. Neck supple.  Cardiovascular: Normal rate, regular rhythm and normal pulses.  No murmur heard. Pulses:      Radial pulses are 2+ on the right side, and 2+ on the left side.       Dorsalis pedis pulses are 2+ on the right side, and 2+ on the left side.  Pulmonary/Chest: Effort normal and breath sounds normal. He has no wheezes. He has no rhonchi. He has no rales.  Abdominal: Soft. He exhibits no distension. There is no tenderness. There is no guarding.  Musculoskeletal: Normal range of motion. He exhibits no edema.  Neurological: He is alert.  Skin: Skin is warm and dry. No rash noted.  Psychiatric: He has a normal mood and affect.  Nursing note and vitals reviewed.    ED Treatments / Results  Labs (all labs ordered are listed, but only abnormal results are displayed) Labs Reviewed  BASIC METABOLIC PANEL - Abnormal; Notable for the following components:      Result Value   CO2 19 (*)    Glucose, Bld 111 (*)    All other components within normal limits  CBC - Abnormal; Notable for the following components:   WBC 14.5 (*)    All other components within normal limits  I-STAT TROPONIN, ED  I-STAT TROPONIN, ED    EKG None  Radiology Dg Chest 2 View  Result Date: 06/21/2018 CLINICAL DATA:  Episodic chest pain and shortness of breath for the past 2 weeks with sudden onset of chest pain associated with nausea and diaphoresis today. Current smoker. EXAM: CHEST - 2 VIEW COMPARISON:  None. FINDINGS: The lungs are well-expanded. The interstitial markings are coarse. There is no alveolar infiltrate or  pleural effusion. The heart and pulmonary vascularity are normal. The mediastinum is normal in width. The bony thorax exhibits no acute abnormality. IMPRESSION: Chronic bronchitic-smoking related changes. No pneumonia, CHF, nor other acute cardiopulmonary abnormality. Electronically Signed   By: David  [place] M.D.   On: 06/21/2018 11:54    Procedures Procedures (including critical care time)  Medications Ordered in ED Medications  acetaminophen (TYLENOL) tablet 650 mg (650 mg Oral Given 06/21/18 1232)  alum & mag hydroxide-simeth (MAALOX/MYLANTA) 200-200-20 MG/5ML suspension 15 mL (15 mLs Oral Given 06/21/18 1232)  ranitidine (ZANTAC) 150 MG/10ML syrup 150 mg (150 mg  Oral Given 06/21/18 1232)     Initial Impression / Assessment and Plan / ED Course  I have reviewed the triage vital signs and the nursing notes.  Pertinent labs & imaging results that were available during my care of the patient were reviewed by me and considered in my medical decision making (see chart for details).     Patient is a 37yo male with no PMHx who presents with acute on chronic sharp central CP that began while driving and has been intermittent over the last 2 weeks.  Patient given full dose ASA and nitro by EMS PTA with some improvement in pain.    EKG obtained and interpreted by myself significant for NSR at 86bpm without evidence of acute ischemia, abnormal intervals, or arrhythmias.  CXR without acute cardiopulmonary abnormality.  Labs grossly unremarkable however patient with mild leukocytosis of 14.5 and CO2 19.  Patient given GI cocktail and tylenol with complete resolution of CP.  Etiology likely 2/2 GERD.  Doubt ACS as patient with low risk HEAR score and troponin x 2 negative.  Doubt PNA as no fever, no cough, and no focal consolidation on CXR.  Doubt dissection as equal bilateral radial pulses, no hx of HTN, and pain resolved.  No evidence of PTX or CHF on CXR.  PERC negative therefore doubt PE.  Patient  stable for d/c home. Instructed patient to quit smoking, decrease EtOH use, and modify diet.  He should establish care with a PCP and take anti-reflux meds PRN.  Old records reviewed.  Imaging and labs reviewed and interpreted by myself and attending and used in the MDM.  Addressed patient question and concerns.  Reviewed discharged instructions with strict precautions given.  Advised patient to schedule follow-up with primary care provider.  Patient verbalized understanding and agrees with plan.  Patient stable at discharge.  The plan for this patient was discussed with Dr. Jeraldine Loots who voiced agreement and who oversaw evaluation and treatment of this patient.   Final Clinical Impressions(s) / ED Diagnoses   Final diagnoses:  Other chest pain  Gastroesophageal reflux disease without esophagitis    ED Discharge Orders    None       Abelardo Diesel, MD 06/21/18 1730    Abelardo Diesel, MD 06/21/18 1751    Gerhard Munch, MD 06/24/18 [phone]

## 2018-06-21 NOTE — Discharge Instructions (Signed)
Establish care with primary care doctor.  Return to the ED for any worsening or other concerns. Take tylenol or motrin as needed for pain.  Take tums, maalox, or zantac (150mg ) as needed for acid reflux.  Stop smoking, avoid greasy/fried foods, and decrease alcohol intake.

## 2018-06-21 NOTE — ED Triage Notes (Signed)
Pt brought in by EMS due to having sudden onset of chest pain. Pt endorses nausea and diaphoresis. Pt received 324mg  of aspirin and nitrox1. Pt is pain free at this time. Pt a&ox4.

## 2018-08-30 ENCOUNTER — Encounter (HOSPITAL_BASED_OUTPATIENT_CLINIC_OR_DEPARTMENT_OTHER): Payer: Self-pay | Admitting: *Deleted

## 2018-08-30 ENCOUNTER — Other Ambulatory Visit: Payer: Self-pay

## 2018-08-30 ENCOUNTER — Emergency Department (HOSPITAL_BASED_OUTPATIENT_CLINIC_OR_DEPARTMENT_OTHER): Payer: BLUE CROSS/BLUE SHIELD

## 2018-08-30 ENCOUNTER — Emergency Department (HOSPITAL_BASED_OUTPATIENT_CLINIC_OR_DEPARTMENT_OTHER)
Admission: EM | Admit: 2018-08-30 | Discharge: 2018-08-30 | Disposition: A | Discharge status: Home / Self Care | Payer: BLUE CROSS/BLUE SHIELD | Attending: Emergency Medicine | Admitting: Emergency Medicine

## 2018-08-30 DIAGNOSIS — J189 Pneumonia, unspecified organism: Secondary | ICD-10-CM

## 2018-08-30 DIAGNOSIS — J181 Lobar pneumonia, unspecified organism: Secondary | ICD-10-CM | POA: Insufficient documentation

## 2018-08-30 DIAGNOSIS — Z79899 Other long term (current) drug therapy: Secondary | ICD-10-CM | POA: Insufficient documentation

## 2018-08-30 DIAGNOSIS — F17210 Nicotine dependence, cigarettes, uncomplicated: Secondary | ICD-10-CM | POA: Diagnosis not present

## 2018-08-30 DIAGNOSIS — R05 Cough: Secondary | ICD-10-CM | POA: Diagnosis present

## 2018-08-30 MED ORDER — BENZONATATE 100 MG PO CAPS: 100.0000 mg | ORAL_CAPSULE | Freq: Three times a day (TID) | ORAL | 0 refills | Status: AC

## 2018-08-30 MED ORDER — ACETAMINOPHEN 325 MG PO TABS
650.0000 mg | ORAL_TABLET | Freq: Once | ORAL | Status: AC
  Administered 2018-08-30: 650 mg via ORAL
  Filled 2018-08-30: qty 2

## 2018-08-30 MED ORDER — DOXYCYCLINE HYCLATE 100 MG PO TABS
100.0000 mg | ORAL_TABLET | Freq: Once | ORAL | Status: AC
  Administered 2018-08-30: 100 mg via ORAL
  Filled 2018-08-30: qty 1

## 2018-08-30 MED ORDER — DOXYCYCLINE HYCLATE 100 MG PO CAPS: 100.0000 mg | ORAL_CAPSULE | Freq: Two times a day (BID) | ORAL | 0 refills | Status: AC

## 2018-08-30 NOTE — ED Provider Notes (Signed)
[facility] EMERGENCY DEPARTMENT Provider Note   CSN: [id number] Arrival date & time: 08/30/18  2118     History   Chief Complaint Chief Complaint  Patient presents with  . Cough  . Fever    HPI Reginald Castillo is a 37 y.o. male who presents with a 4-day history of cough and fever.  He is also had some lightheadedness with standing.  He denies any chest pain or shortness of breath.  He reports having episodes at night where he is woken up in a panic attack.  He has been stressed about his illness as his friend just recently died from pneumonia.  Patient also reports prior to his illness, he wakes up in the middle of the night not able to breathe.  He reports his fever has been up to 103.  His family has had similar symptoms.  He has had some sore throat, but denies any ear pain he denies any abdominal pain, nausea, vomiting.  He has had some nonbloody, loose stools. He smokes cigarettes and marijuana.  Patient has been taking NyQuil and ibuprofen at home with some relief.  HPI  History reviewed. No pertinent past medical history.  Patient Active Problem List   Diagnosis Date Noted  . Inguinal hernia, left 03/13/2013    Past Surgical History:  Procedure Laterality Date  . WRIST SURGERY          Home Medications    Prior to Admission medications   Medication Sig Start Date End Date Taking? Authorizing Provider  ibuprofen (ADVIL,MOTRIN) 800 MG tablet Take 1 tablet (800 mg total) by mouth every 8 (eight) hours as needed. 03/07/17  Yes Lawyer, Cristal Deerhristopher, PA-C  benzonatate (TESSALON) 100 MG capsule Take 1 capsule (100 mg total) by mouth every 8 (eight) hours. 08/30/18   Lori Popowski, Waylan BogaAlexandra M, PA-C  doxycycline (VIBRAMYCIN) 100 MG capsule Take 1 capsule (100 mg total) by mouth 2 (two) times daily. 08/30/18   Shaft Corigliano, Waylan BogaAlexandra M, PA-C  HYDROcodone-acetaminophen (NORCO) 5-325 MG per tablet Take 1 tablet by mouth every 6 (six) hours as needed. Patient not taking: Reported on  06/21/2018 05/07/15   Janne NapoleonNeese, Hope M, NP  hydrOXYzine (ATARAX/VISTARIL) 25 MG tablet Take 1 tablet (25 mg total) by mouth every 8 (eight) hours as needed. Patient not taking: Reported on 06/21/2018 04/02/15   Janne NapoleonNeese, Hope M, NP  loratadine (CLARITIN) 10 MG tablet Take 1 tablet (10 mg total) by mouth daily. Patient not taking: Reported on 06/21/2018 04/02/15   Janne NapoleonNeese, Hope M, NP  naproxen (NAPROSYN) 500 MG tablet Take 1 tablet (500 mg total) by mouth 2 (two) times daily. Patient not taking: Reported on 06/21/2018 05/07/15   Janne NapoleonNeese, Hope M, NP  predniSONE (STERAPRED UNI-PAK 21 TAB) 10 MG (21) TBPK tablet Take 1 tablet (10 mg total) by mouth daily. Starting tomorrow take 6 tablets PO then 5, 4, 3, 2, 1 Patient not taking: Reported on 06/21/2018 04/02/15   Janne NapoleonNeese, Hope M, NP  traMADol (ULTRAM) 50 MG tablet Take 1 tablet (50 mg total) by mouth every 6 (six) hours as needed for severe pain. Patient not taking: Reported on 06/21/2018 03/07/17   Charlestine NightLawyer, Christopher, PA-C    Family History Family History  Problem Relation Age of Onset  . Cancer Mother        breast  . Cancer Maternal Grandmother        breast  . Cancer Maternal Grandfather        prostate  . Cancer Paternal Grandfather  prostate    Social History Social History   Tobacco Use  . Smoking status: Current Every Day Smoker    Packs/day: 0.50  . Smokeless tobacco: Never Used  Substance Use Topics  . Alcohol use: No  . Drug use: Yes    Types: Marijuana     Allergies   Patient has no known allergies.   Review of Systems Review of Systems  Constitutional: Positive for chills, diaphoresis and fever.  HENT: Positive for congestion and sore throat. Negative for ear pain and facial swelling.   Respiratory: Positive for cough. Negative for shortness of breath.   Cardiovascular: Negative for chest pain.  Gastrointestinal: Negative for abdominal pain, nausea and vomiting.  Genitourinary: Negative for dysuria.  Musculoskeletal: Negative  for back pain.  Skin: Negative for rash and wound.  Neurological: Positive for light-headedness. Negative for headaches.  Psychiatric/Behavioral: The patient is not nervous/anxious.      Physical Exam Updated Vital Signs BP (!) 153/104 (BP Location: Left Arm)   Pulse (!) 103   Temp 98.4 F (36.9 C) (Oral)   Resp 18   Ht 6' (1.829 m)   Wt 124.7 kg   SpO2 98%   BMI 37.30 kg/m   Physical Exam  Constitutional: He appears well-developed and well-nourished. No distress.  HENT:  Head: Normocephalic and atraumatic.  Mouth/Throat: Oropharynx is clear and moist. No oropharyngeal exudate.  Eyes: Pupils are equal, round, and reactive to light. Conjunctivae are normal. Right eye exhibits no discharge. Left eye exhibits no discharge. No scleral icterus.  Neck: Normal range of motion. Neck supple. No thyromegaly present.  Cardiovascular: Regular rhythm, normal heart sounds and intact distal pulses. Exam reveals no gallop and no friction rub.  No murmur heard. Pulmonary/Chest: Effort normal. No stridor. No respiratory distress. He has no wheezes. He has rales.  Abdominal: Soft. Bowel sounds are normal. He exhibits no distension. There is no tenderness. There is no rebound and no guarding.  Musculoskeletal: He exhibits no edema.  Lymphadenopathy:    He has no cervical adenopathy.  Neurological: He is alert. Coordination normal.  Skin: Skin is warm. No rash noted. He is diaphoretic. No pallor.  Psychiatric: He has a normal mood and affect.  Nursing note and vitals reviewed.    ED Treatments / Results  Labs (all labs ordered are listed, but only abnormal results are displayed) Labs Reviewed - No data to display  EKG None  Radiology Dg Chest 2 View  Result Date: 08/30/2018 CLINICAL DATA:  Cough and fever EXAM: CHEST - 2 VIEW COMPARISON:  06/21/2018 FINDINGS: New bandlike consolidation in the lingula compatible with atelectasis and/or pneumonia. This is superimposed on chronic  bronchitic change. Heart and mediastinal contours are stable and within normal limits. Minimal aortic atherosclerosis is noted. No acute osseous abnormality. IMPRESSION: New bandlike opacity in the lingula suspicious for atelectasis and/or pneumonia. This is superimposed on chronic bronchitic change. Electronically Signed   By: Tollie Eth M.D.   On: 08/30/2018 22:13    Procedures Procedures (including critical care time)  Medications Ordered in ED Medications  acetaminophen (TYLENOL) tablet 650 mg (650 mg Oral Given 08/30/18 2126)  doxycycline (VIBRA-TABS) tablet 100 mg (100 mg Oral Given 08/30/18 2300)     Initial Impression / Assessment and Plan / ED Course  I have reviewed the triage vital signs and the nursing notes.  Pertinent labs & imaging results that were available during my care of the patient were reviewed by me and considered in my medical  decision making (see chart for details).     Patient with suspected community-acquired pneumonia.  X-ray shows new bandlike opacity in lingula.  Patient walked with good oxygen saturations.  He felt mildly lightheaded after, however it resolved quickly after sitting down.  We will treat with doxycycline.  Supportive treatment discussed including ibuprofen, Tylenol, Tessalon, rest, fluids.  Follow-up to establish care with a PCP as well as further work-up for sleep apnea.  Strict return precautions given.  Patient understands and agrees with plan.  Patient vitals stable throughout ED course and discharged in satisfactory condition.  Final Clinical Impressions(s) / ED Diagnoses   Final diagnoses:  Lingular pneumonia  Community acquired pneumonia of left upper lobe of lung [facility])    ED Discharge Orders         Ordered    doxycycline (VIBRAMYCIN) 100 MG capsule  2 times daily     08/30/18 2245    benzonatate (TESSALON) 100 MG capsule  Every 8 hours     08/30/18 2245           Emi Holes, PA-C 08/30/18 2326    Mesner, Barbara Cower,  MD 08/30/18 2340

## 2018-08-30 NOTE — Progress Notes (Signed)
Patient ambulated around the department while on pulse ox.   Patient's SPO2 remained between 97% and 99% and HR remained between 100 and 104.  Upon returning to the room the patient stated that he felt a little light headed.

## 2018-08-30 NOTE — ED Triage Notes (Signed)
Cough and fever x 4 days. Ibuprofen 5 hours ago.

## 2018-08-30 NOTE — ED Notes (Signed)
Pt c/o fever and cough since thanksgiving morning. He began to get worse last night and is coughing so much at times that he vomits. Two of his children have recently had URIs. Pt has a strong, non-productive cough and is unable to take a deep breath without coughing. States he has yellow sinus drainage. He took nyquil and 600mg  ibuprofen about 5 hours ago.

## 2018-08-30 NOTE — ED Notes (Signed)
Patient transported to X-ray 

## 2018-08-30 NOTE — Discharge Instructions (Addendum)
Take doxycycline until completed.  Take Tessalon every 8 hours as needed for cough.  You can take NyQuil at night instead, but do not combine the 2.  Please follow-up and establish care with a primary care provider for further evaluation and treatment of your probable sleep apnea.  Please return the emergency department immediately if you develop any new or worsening symptoms.

## 2019-06-19 ENCOUNTER — Emergency Department (HOSPITAL_BASED_OUTPATIENT_CLINIC_OR_DEPARTMENT_OTHER)
Admission: EM | Admit: 2019-06-19 | Discharge: 2019-06-19 | Disposition: A | Discharge status: Home / Self Care | Payer: BLUE CROSS/BLUE SHIELD | Attending: Emergency Medicine | Admitting: Emergency Medicine

## 2019-06-19 ENCOUNTER — Other Ambulatory Visit: Payer: Self-pay

## 2019-06-19 ENCOUNTER — Encounter (HOSPITAL_BASED_OUTPATIENT_CLINIC_OR_DEPARTMENT_OTHER): Payer: Self-pay

## 2019-06-19 DIAGNOSIS — M5412 Radiculopathy, cervical region: Secondary | ICD-10-CM

## 2019-06-19 DIAGNOSIS — M542 Cervicalgia: Secondary | ICD-10-CM

## 2019-06-19 DIAGNOSIS — F17210 Nicotine dependence, cigarettes, uncomplicated: Secondary | ICD-10-CM | POA: Insufficient documentation

## 2019-06-19 MED ORDER — HYDROCODONE-ACETAMINOPHEN 5-325 MG PO TABS
1.0000 | ORAL_TABLET | Freq: Once | ORAL | Status: AC
  Administered 2019-06-19: 1 via ORAL
  Filled 2019-06-19: qty 1

## 2019-06-19 MED ORDER — PREDNISONE 10 MG PO TABS: 40.0000 mg | ORAL_TABLET | Freq: Every day | ORAL | 0 refills | Status: DC

## 2019-06-19 MED ORDER — HYDROCODONE-ACETAMINOPHEN 5-325 MG PO TABS: 1.0000 | ORAL_TABLET | Freq: Four times a day (QID) | ORAL | 0 refills | Status: DC | PRN

## 2019-06-19 MED ORDER — PREDNISONE 50 MG PO TABS
60.0000 mg | ORAL_TABLET | Freq: Once | ORAL | Status: AC
  Administered 2019-06-19: 60 mg via ORAL
  Filled 2019-06-19: qty 1

## 2019-06-19 NOTE — ED Triage Notes (Signed)
Pt states Saturday while lifting heavy object felt pull left side of neck into shoulder.  Limited ROM, tried hot shower, and ibuprofen with little relief.

## 2019-06-19 NOTE — ED Provider Notes (Signed)
[facility] EMERGENCY DEPARTMENT Provider Note   CSN: [id number] Arrival date & time: 06/19/19  0920     History   Chief Complaint Chief Complaint  Patient presents with  . Neck Pain    HPI Reginald Castillo is a 38 y.o. male.     Patient with a complaint of left-sided neck pain with radiation into the arm.  Feels better resting his arm on top of his head.  He has numbness to his little finger and ring finger on the left side.  It occurred immediately after lifting a heavy object.  No fall.     History reviewed. No pertinent past medical history.  Patient Active Problem List   Diagnosis Date Noted  . Inguinal hernia, left 03/13/2013    Past Surgical History:  Procedure Laterality Date  . WRIST SURGERY          Home Medications    Prior to Admission medications   Medication Sig Start Date End Date Taking? Authorizing Provider  benzonatate (TESSALON) 100 MG capsule Take 1 capsule (100 mg total) by mouth every 8 (eight) hours. 08/30/18   Law, Waylan BogaAlexandra M, PA-C  doxycycline (VIBRAMYCIN) 100 MG capsule Take 1 capsule (100 mg total) by mouth 2 (two) times daily. 08/30/18   Law, Waylan BogaAlexandra M, PA-C  HYDROcodone-acetaminophen (NORCO) 5-325 MG per tablet Take 1 tablet by mouth every 6 (six) hours as needed. Patient not taking: Reported on 06/21/2018 05/07/15   Janne NapoleonNeese, Hope M, NP  HYDROcodone-acetaminophen (NORCO/VICODIN) 5-325 MG tablet Take 1 tablet by mouth every 6 (six) hours as needed for moderate pain. 06/19/19   Vanetta MuldersZackowski, Chai Verdejo, MD  hydrOXYzine (ATARAX/VISTARIL) 25 MG tablet Take 1 tablet (25 mg total) by mouth every 8 (eight) hours as needed. Patient not taking: Reported on 06/21/2018 04/02/15   Janne NapoleonNeese, Hope M, NP  ibuprofen (ADVIL,MOTRIN) 800 MG tablet Take 1 tablet (800 mg total) by mouth every 8 (eight) hours as needed. 03/07/17   Lawyer, Cristal Deerhristopher, PA-C  loratadine (CLARITIN) 10 MG tablet Take 1 tablet (10 mg total) by mouth daily. Patient not taking: Reported on  06/21/2018 04/02/15   Janne NapoleonNeese, Hope M, NP  naproxen (NAPROSYN) 500 MG tablet Take 1 tablet (500 mg total) by mouth 2 (two) times daily. Patient not taking: Reported on 06/21/2018 05/07/15   Janne NapoleonNeese, Hope M, NP  predniSONE (DELTASONE) 10 MG tablet Take 4 tablets (40 mg total) by mouth daily. 06/19/19   Vanetta MuldersZackowski, Roczen Waymire, MD  predniSONE (STERAPRED UNI-PAK 21 TAB) 10 MG (21) TBPK tablet Take 1 tablet (10 mg total) by mouth daily. Starting tomorrow take 6 tablets PO then 5, 4, 3, 2, 1 Patient not taking: Reported on 06/21/2018 04/02/15   Janne NapoleonNeese, Hope M, NP  traMADol (ULTRAM) 50 MG tablet Take 1 tablet (50 mg total) by mouth every 6 (six) hours as needed for severe pain. Patient not taking: Reported on 06/21/2018 03/07/17   Charlestine NightLawyer, Christopher, PA-C    Family History Family History  Problem Relation Age of Onset  . Cancer Mother        breast  . Cancer Maternal Grandmother        breast  . Cancer Maternal Grandfather        prostate  . Cancer Paternal Grandfather        prostate    Social History Social History   Tobacco Use  . Smoking status: Current Every Day Smoker    Packs/day: 0.50  . Smokeless tobacco: Never Used  Substance Use Topics  .  Alcohol use: No  . Drug use: Yes    Types: Marijuana     Allergies   Patient has no known allergies.   Review of Systems Review of Systems  Constitutional: Negative for chills and fever.  HENT: Negative for congestion, rhinorrhea and sore throat.   Eyes: Negative for visual disturbance.  Respiratory: Negative for cough and shortness of breath.   Cardiovascular: Negative for chest pain and leg swelling.  Gastrointestinal: Negative for abdominal pain, diarrhea, nausea and vomiting.  Genitourinary: Negative for dysuria.  Musculoskeletal: Positive for neck pain. Negative for back pain.  Skin: Negative for rash.  Neurological: Positive for numbness. Negative for dizziness, light-headedness and headaches.  Hematological: Does not bruise/bleed easily.   Psychiatric/Behavioral: Negative for confusion.     Physical Exam Updated Vital Signs BP (!) 156/86 (BP Location: Right Arm)   Pulse (!) 106   Temp 97.8 F (36.6 C) (Oral)   Resp 18   Ht 1.829 m (6')   Wt 127 kg   SpO2 100%   BMI 37.97 kg/m   Physical Exam Vitals signs and nursing note reviewed.  Constitutional:      Appearance: He is well-developed.  HENT:     Head: Normocephalic and atraumatic.  Eyes:     Extraocular Movements: Extraocular movements intact.     Conjunctiva/sclera: Conjunctivae normal.     Pupils: Pupils are equal, round, and reactive to light.  Neck:     Comments: Some decreased range of motion due to pain to the left lateral side of the neck. Cardiovascular:     Rate and Rhythm: Normal rate and regular rhythm.     Heart sounds: No murmur.  Pulmonary:     Effort: Pulmonary effort is normal. No respiratory distress.     Breath sounds: Normal breath sounds.  Abdominal:     Palpations: Abdomen is soft.     Tenderness: There is no abdominal tenderness.  Musculoskeletal:        General: No tenderness or deformity.     Comments: Radial pulses 2+ on the left side.  Has some mild numbness to the left little finger and ring finger.  No motor weakness.  Lymphadenopathy:     Cervical: No cervical adenopathy.  Skin:    General: Skin is warm and dry.     Capillary Refill: Capillary refill takes less than 2 seconds.  Neurological:     General: No focal deficit present.     Mental Status: He is alert and oriented to person, place, and time.     Cranial Nerves: No cranial nerve deficit.     Sensory: Sensory deficit present.     Motor: No weakness.      ED Treatments / Results  Labs (all labs ordered are listed, but only abnormal results are displayed) Labs Reviewed - No data to display  EKG None  Radiology No results found.  Procedures Procedures (including critical care time)  Medications Ordered in ED Medications  predniSONE (DELTASONE)  tablet 60 mg (60 mg Oral Given 06/19/19 1057)  HYDROcodone-acetaminophen (NORCO/VICODIN) 5-325 MG per tablet 1 tablet (1 tablet Oral Given 06/19/19 1057)     Initial Impression / Assessment and Plan / ED Course  I have reviewed the triage vital signs and the nursing notes.  Pertinent labs & imaging results that were available during my care of the patient were reviewed by me and considered in my medical decision making (see chart for details).        Patient  symptoms suggestive of left-sided cervical radiculopathy.T1 distribution.  Will treat with prednisone and pain medication.  Work note to be out of work for 2 days.  Follow-up with sports medicine.  Patient understands very important to follow-up for further evaluation if that if pain does not improve over the next 2 weeks same goes for the numbness.  Final Clinical Impressions(s) / ED Diagnoses   Final diagnoses:  Neck pain  Cervical radiculopathy    ED Discharge Orders         Ordered    HYDROcodone-acetaminophen (NORCO/VICODIN) 5-325 MG tablet  Every 6 hours PRN     06/19/19 1102    predniSONE (DELTASONE) 10 MG tablet  Daily     06/19/19 1102           Vanetta Mulders, MD 06/19/19 1107

## 2019-06-19 NOTE — Discharge Instructions (Signed)
Take the prednisone as directed for the next 5 days.  Take the hydrocodone as needed for pain.  Make an appointment to follow-up with sports medicine.  Work note provided to be out of work the next 2 days.  Definitely need follow-up if not improving in 2 weeks.

## 2019-07-04 ENCOUNTER — Emergency Department (HOSPITAL_BASED_OUTPATIENT_CLINIC_OR_DEPARTMENT_OTHER)
Admission: EM | Admit: 2019-07-04 | Discharge: 2019-07-04 | Disposition: A | Discharge status: Home / Self Care | Payer: Self-pay | Attending: Emergency Medicine | Admitting: Emergency Medicine

## 2019-07-04 ENCOUNTER — Other Ambulatory Visit: Payer: Self-pay

## 2019-07-04 ENCOUNTER — Encounter (HOSPITAL_BASED_OUTPATIENT_CLINIC_OR_DEPARTMENT_OTHER): Payer: Self-pay | Admitting: Emergency Medicine

## 2019-07-04 DIAGNOSIS — S161XXD Strain of muscle, fascia and tendon at neck level, subsequent encounter: Secondary | ICD-10-CM

## 2019-07-04 DIAGNOSIS — F17210 Nicotine dependence, cigarettes, uncomplicated: Secondary | ICD-10-CM | POA: Insufficient documentation

## 2019-07-04 DIAGNOSIS — S161XXA Strain of muscle, fascia and tendon at neck level, initial encounter: Secondary | ICD-10-CM | POA: Insufficient documentation

## 2019-07-04 DIAGNOSIS — X500XXA Overexertion from strenuous movement or load, initial encounter: Secondary | ICD-10-CM | POA: Insufficient documentation

## 2019-07-04 DIAGNOSIS — Y929 Unspecified place or not applicable: Secondary | ICD-10-CM | POA: Insufficient documentation

## 2019-07-04 DIAGNOSIS — Y9389 Activity, other specified: Secondary | ICD-10-CM | POA: Insufficient documentation

## 2019-07-04 DIAGNOSIS — Y999 Unspecified external cause status: Secondary | ICD-10-CM | POA: Insufficient documentation

## 2019-07-04 MED ORDER — MELOXICAM 7.5 MG PO TABS: 7.5000 mg | ORAL_TABLET | Freq: Two times a day (BID) | ORAL | 0 refills | Status: AC | PRN

## 2019-07-04 MED ORDER — HYDROCODONE-ACETAMINOPHEN 5-325 MG PO TABS: 2.0000 | ORAL_TABLET | Freq: Four times a day (QID) | ORAL | 0 refills | Status: AC | PRN

## 2019-07-04 MED ORDER — PREDNISONE 20 MG PO TABS: 40.0000 mg | ORAL_TABLET | Freq: Every day | ORAL | 0 refills | Status: AC

## 2019-07-04 NOTE — ED Notes (Signed)
Family at bedside. 

## 2019-07-04 NOTE — ED Notes (Signed)
Pt c/o elbow and axilla pain

## 2019-07-04 NOTE — Discharge Instructions (Signed)
Please review the attachment.  Follow-up with sports medicine group orthopedist ASAP for further and ongoing evaluation and management.  Return to ED or seek medical attention if develop any new or worsening neurologic findings or if you develop chest pain or shortness of breath.

## 2019-07-04 NOTE — ED Provider Notes (Signed)
[facility] EMERGENCY DEPARTMENT Provider Note   CSN: [id number] Arrival date & time: 07/04/19  1328     History   Chief Complaint Chief Complaint  Patient presents with  . Arm Pain    HPI Reginald Castillo is a 38 y.o. male with no significant PMH who presents to the ED for sharp pain in his left arm and axillary region. Evidently he had initially injured his arm 1 week ago while lifting a heavy object and had been diagnosed with cervical radiculopathy.  He was informed to follow-up with sports medicine if his symptoms did not improve.  Today he states that he was lifting a mere 3 pound box 1 he reaggravated his symptoms.  He denies any numbness, but endorses tingling discomfort on palmar aspect of left index finger.  He also complains of 10 out of 10, sharp, left supracondylar and axillary pain.  The symptoms are most aggravated by left arm abduction.  He also endorses left cervical and left trapezial discomfort, particularly when turning his head to the right.  Pain is relatively improved when keeping left arm flexed, adducted, and internal rotated.  He states that his previous prescription of prednisone burst and Vicodin helped with his symptoms, but he also supplemented with ibuprofen.  He denies any shortness of breath, chest pain, or any other symptoms.     HPI  History reviewed. No pertinent past medical history.  Patient Active Problem List   Diagnosis Date Noted  . Inguinal hernia, left 03/13/2013    Past Surgical History:  Procedure Laterality Date  . WRIST SURGERY          Home Medications    Prior to Admission medications   Medication Sig Start Date End Date Taking? Authorizing Provider  benzonatate (TESSALON) 100 MG capsule Take 1 capsule (100 mg total) by mouth every 8 (eight) hours. 08/30/18   Law, Bea Graff, PA-C  doxycycline (VIBRAMYCIN) 100 MG capsule Take 1 capsule (100 mg total) by mouth 2 (two) times daily. 08/30/18   Law, Bea Graff, PA-C   HYDROcodone-acetaminophen (NORCO/VICODIN) 5-325 MG tablet Take 2 tablets by mouth every 6 (six) hours as needed for moderate pain. 07/04/19   Corena Herter, PA-C  hydrOXYzine (ATARAX/VISTARIL) 25 MG tablet Take 1 tablet (25 mg total) by mouth every 8 (eight) hours as needed. Patient not taking: Reported on 06/21/2018 04/02/15   Ashley Murrain, NP  ibuprofen (ADVIL,MOTRIN) 800 MG tablet Take 1 tablet (800 mg total) by mouth every 8 (eight) hours as needed. 03/07/17   Lawyer, Harrell Gave, PA-C  loratadine (CLARITIN) 10 MG tablet Take 1 tablet (10 mg total) by mouth daily. Patient not taking: Reported on 06/21/2018 04/02/15   Ashley Murrain, NP  meloxicam (MOBIC) 7.5 MG tablet Take 1 tablet (7.5 mg total) by mouth 2 (two) times daily as needed for pain. 07/04/19   Corena Herter, PA-C  naproxen (NAPROSYN) 500 MG tablet Take 1 tablet (500 mg total) by mouth 2 (two) times daily. Patient not taking: Reported on 06/21/2018 05/07/15   Ashley Murrain, NP  predniSONE (DELTASONE) 20 MG tablet Take 2 tablets (40 mg total) by mouth daily with breakfast for 5 days. 07/04/19 07/09/19  Corena Herter, PA-C  traMADol (ULTRAM) 50 MG tablet Take 1 tablet (50 mg total) by mouth every 6 (six) hours as needed for severe pain. Patient not taking: Reported on 06/21/2018 03/07/17   Dalia Heading, PA-C    Family History Family History  Problem Relation  Age of Onset  . Cancer Mother        breast  . Cancer Maternal Grandmother        breast  . Cancer Maternal Grandfather        prostate  . Cancer Paternal Grandfather        prostate    Social History Social History   Tobacco Use  . Smoking status: Current Every Day Smoker    Packs/day: 0.50  . Smokeless tobacco: Never Used  Substance Use Topics  . Alcohol use: No  . Drug use: Yes    Types: Marijuana     Allergies   Patient has no known allergies.   Review of Systems Review of Systems  All other systems reviewed and are negative.    Physical Exam  Updated Vital Signs BP (!) 161/106 (BP Location: Right Arm)   Pulse 89   Temp 98 F (36.7 C) (Oral)   Resp 18   Ht 6' (1.829 m)   Wt 131.5 kg   SpO2 100%   BMI 39.33 kg/m   Physical Exam Vitals signs and nursing note reviewed. Exam conducted with a chaperone present.  Constitutional:      Appearance: Normal appearance.     Comments: Appears uncomfortable.  HENT:     Head: Normocephalic and atraumatic.  Eyes:     General: No scleral icterus.    Conjunctiva/sclera: Conjunctivae normal.  Pulmonary:     Effort: Pulmonary effort is normal. No respiratory distress.  Musculoskeletal:     Comments: No evidence of overlying erythema or discoloration.  No obvious swelling.  Sensation, pulses, and cap refill intact distally.  Significant tenderness to palpation of left supracondylar and left axillary regions.  Patient also has significant left trapezial discomfort, particularly the paraspinous muscles.  No cervical or thoracic spine midline tenderness to palpation.  Pain exacerbated by left arm abduction, particularly in axillary region.  Skin:    General: Skin is dry.  Neurological:     Mental Status: He is alert.     GCS: GCS eye subscore is 4. GCS verbal subscore is 5. GCS motor subscore is 6.  Psychiatric:        Mood and Affect: Mood normal.        Behavior: Behavior normal.        Thought Content: Thought content normal.      ED Treatments / Results  Labs (all labs ordered are listed, but only abnormal results are displayed) Labs Reviewed - No data to display  EKG None  Radiology No results found.  Procedures Procedures (including critical care time)  Medications Ordered in ED Medications - No data to display   Initial Impression / Assessment and Plan / ED Course  I have reviewed the triage vital signs and the nursing notes.  Pertinent labs & imaging results that were available during my care of the patient were reviewed by me and considered in my medical  decision making (see chart for details).       Patient's presentation and history is concerning for ongoing cervical strain and T1 radiculopathy, however it is also concerning for thoracic outlet syndrome.  Will provide patient another round of prednisone burst, Vicodin for pain relief, and Mobic.  Encouraged him to watch for reflux and melena.  No history of peptic ulcers or kidney disease.  Patient plans to follow-up with his orthopedist ASAP in [facility] for further evaluation and imaging.  He likely will require MRI if problem continues to persist.  There is no evidence or history of trauma and do not suspect that there is any broken bones or dislocations.  His pain that he describes pulse T1 distribution in his likely related to an impinged nerve.  Discussed my thoughts and patient voices understanding and is agreeable to plan.   Final Clinical Impressions(s) / ED Diagnoses   Final diagnoses:  Cervical strain, acute, subsequent encounter    ED Discharge Orders         Ordered    predniSONE (DELTASONE) 20 MG tablet  Daily with breakfast     07/04/19 1557    HYDROcodone-acetaminophen (NORCO/VICODIN) 5-325 MG tablet  Every 6 hours PRN     07/04/19 1557    meloxicam (MOBIC) 7.5 MG tablet  2 times daily PRN     07/04/19 1557           Lorelee NewGreen, Bernabe Dorce L, PA-C 07/04/19 1606    Arby BarrettePfeiffer, Marcy, MD 07/04/19 [phone]

## 2019-07-04 NOTE — ED Triage Notes (Signed)
He hurt his L arm at work 1 week ago while lifting a heavy object. He was feeling better and returned to work yesterday and when he lifted something he felt a sharp pain to arm and axilla area.

## 2019-07-18 ENCOUNTER — Other Ambulatory Visit: Payer: Self-pay | Admitting: Orthopedic Surgery

## 2019-07-18 DIAGNOSIS — M542 Cervicalgia: Secondary | ICD-10-CM

## 2019-08-05 ENCOUNTER — Other Ambulatory Visit: Payer: Self-pay

## 2019-08-06 ENCOUNTER — Other Ambulatory Visit: Payer: Self-pay

## 2020-07-23 ENCOUNTER — Emergency Department (HOSPITAL_BASED_OUTPATIENT_CLINIC_OR_DEPARTMENT_OTHER): Payer: BC Managed Care – PPO

## 2020-07-23 ENCOUNTER — Encounter (HOSPITAL_BASED_OUTPATIENT_CLINIC_OR_DEPARTMENT_OTHER): Payer: Self-pay | Admitting: Emergency Medicine

## 2020-07-23 ENCOUNTER — Other Ambulatory Visit: Payer: Self-pay

## 2020-07-23 ENCOUNTER — Emergency Department (HOSPITAL_BASED_OUTPATIENT_CLINIC_OR_DEPARTMENT_OTHER)
Admission: EM | Admit: 2020-07-23 | Discharge: 2020-07-23 | Disposition: A | Discharge status: Home / Self Care | Payer: BC Managed Care – PPO | Attending: Emergency Medicine | Admitting: Emergency Medicine

## 2020-07-23 DIAGNOSIS — R110 Nausea: Secondary | ICD-10-CM | POA: Insufficient documentation

## 2020-07-23 DIAGNOSIS — R109 Unspecified abdominal pain: Secondary | ICD-10-CM

## 2020-07-23 DIAGNOSIS — F17210 Nicotine dependence, cigarettes, uncomplicated: Secondary | ICD-10-CM | POA: Insufficient documentation

## 2020-07-23 HISTORY — DX: Unilateral inguinal hernia, without obstruction or gangrene, not specified as recurrent: K40.90

## 2020-07-23 HISTORY — DX: Disorder of kidney and ureter, unspecified: N28.9

## 2020-07-23 LAB — URINALYSIS, ROUTINE W REFLEX MICROSCOPIC
Bilirubin Urine: NEGATIVE
Glucose, UA: NEGATIVE mg/dL
Ketones, ur: NEGATIVE mg/dL
Leukocytes,Ua: NEGATIVE
Nitrite: NEGATIVE
Protein, ur: NEGATIVE mg/dL
Specific Gravity, Urine: 1.02 (ref 1.005–1.030)
pH: 6 (ref 5.0–8.0)

## 2020-07-23 LAB — CBC WITH DIFFERENTIAL/PLATELET
Abs Immature Granulocytes: 0.13 10*3/uL — ABNORMAL HIGH (ref 0.00–0.07)
Basophils Absolute: 0.1 10*3/uL (ref 0.0–0.1)
Basophils Relative: 1 %
Eosinophils Absolute: 0.8 10*3/uL — ABNORMAL HIGH (ref 0.0–0.5)
Eosinophils Relative: 5 %
HCT: 50.8 % (ref 39.0–52.0)
Hemoglobin: 17.2 g/dL — ABNORMAL HIGH (ref 13.0–17.0)
Immature Granulocytes: 1 %
Lymphocytes Relative: 37 %
Lymphs Abs: 5.9 10*3/uL — ABNORMAL HIGH (ref 0.7–4.0)
MCH: 29.6 pg (ref 26.0–34.0)
MCHC: 33.9 g/dL (ref 30.0–36.0)
MCV: 87.3 fL (ref 80.0–100.0)
Monocytes Absolute: 1.2 10*3/uL — ABNORMAL HIGH (ref 0.1–1.0)
Monocytes Relative: 8 %
Neutro Abs: 7.8 10*3/uL — ABNORMAL HIGH (ref 1.7–7.7)
Neutrophils Relative %: 48 %
Platelets: 402 10*3/uL — ABNORMAL HIGH (ref 150–400)
RBC: 5.82 MIL/uL — ABNORMAL HIGH (ref 4.22–5.81)
RDW: 13.7 % (ref 11.5–15.5)
WBC: 15.9 10*3/uL — ABNORMAL HIGH (ref 4.0–10.5)
nRBC: 0 % (ref 0.0–0.2)

## 2020-07-23 LAB — BASIC METABOLIC PANEL
Anion gap: 10 (ref 5–15)
BUN: 13 mg/dL (ref 6–20)
CO2: 24 mmol/L (ref 22–32)
Calcium: 10.1 mg/dL (ref 8.9–10.3)
Chloride: 103 mmol/L (ref 98–111)
Creatinine, Ser: 0.91 mg/dL (ref 0.61–1.24)
GFR, Estimated: >60 mL/min (ref >60)
Glucose, Bld: 127 mg/dL — ABNORMAL HIGH (ref 70–99)
Potassium: 4 mmol/L (ref 3.5–5.1)
Sodium: 137 mmol/L (ref 135–145)

## 2020-07-23 LAB — URINALYSIS, MICROSCOPIC (REFLEX)
Bacteria, UA: NONE SEEN
Squamous Epithelial / LPF: NONE SEEN (ref 0–5)
WBC, UA: NONE SEEN WBC/hpf (ref 0–5)

## 2020-07-23 MED ORDER — KETOROLAC TROMETHAMINE 30 MG/ML IJ SOLN
30.0000 mg | Freq: Once | INTRAMUSCULAR | Status: AC
  Administered 2020-07-23: 30 mg via INTRAVENOUS
  Filled 2020-07-23: qty 1

## 2020-07-23 MED ORDER — FENTANYL CITRATE (PF) 100 MCG/2ML IJ SOLN
100.0000 ug | Freq: Once | INTRAMUSCULAR | Status: AC
  Administered 2020-07-23: 100 ug via INTRAVENOUS
  Filled 2020-07-23: qty 2

## 2020-07-23 MED ORDER — ONDANSETRON HCL 4 MG/2ML IJ SOLN
4.0000 mg | Freq: Once | INTRAMUSCULAR | Status: AC
  Administered 2020-07-23: 4 mg via INTRAVENOUS
  Filled 2020-07-23: qty 2

## 2020-07-23 NOTE — ED Triage Notes (Signed)
Pt c/o left flank pain that started x 4 days ago but worsened tonight with vomiting. Pt reports urine is dark orange in color.

## 2020-07-23 NOTE — ED Notes (Signed)
ED Provider at bedside. 

## 2020-07-23 NOTE — ED Provider Notes (Signed)
I received the patient in signout from Dr. Bebe Shaggy.  Briefly the patient is a 39 year old male with a chief complaint of left flank pain.  Patient had trace hemoglobin on dipstick has a history of kidney stones and plan for CT scan to evaluate.  CT scan has returned and is negative.  I discussed the results with the patient.  On my exam the patient has focal left-sided back tenderness, I suspect this is more likely musculoskeletal.  Will treat as such.  PCP follow-up.   Melene Plan, DO 07/23/20 0730

## 2020-07-23 NOTE — ED Notes (Signed)
Patient transported to CT 

## 2020-07-23 NOTE — ED Provider Notes (Signed)
[facility] EMERGENCY DEPARTMENT Provider Note   CSN: [id number] Arrival date & time: 07/23/20  0426     History Chief Complaint  Patient presents with  . Flank Pain    Reginald Castillo is a 39 y.o. male.  The history is provided by the patient.  Flank Pain This is a new problem. The current episode started more than 2 days ago. The problem occurs daily. The problem has been rapidly worsening. Pertinent negatives include no chest pain, no abdominal pain and no shortness of breath. Nothing aggravates the symptoms. Nothing relieves the symptoms. He has tried nothing for the symptoms.  Patient presents with left flank pain.  He reports approximately 4 days ago began having pain in his left flank.  Over the past day it is worsened, causing intense nausea.  No chest pain/shortness of breath.  No abdominal pain.  No dysuria, but does report discolored urine     Past Medical History:  Diagnosis Date  . Inguinal hernia   . Renal disorder     Patient Active Problem List   Diagnosis Date Noted  . Inguinal hernia, left 03/13/2013    Past Surgical History:  Procedure Laterality Date  . WRIST SURGERY         Family History  Problem Relation Age of Onset  . Cancer Mother        breast  . Cancer Maternal Grandmother        breast  . Cancer Maternal Grandfather        prostate  . Cancer Paternal Grandfather        prostate    Social History   Tobacco Use  . Smoking status: Current Every Day Smoker    Packs/day: 0.50  . Smokeless tobacco: Never Used  Substance Use Topics  . Alcohol use: No  . Drug use: Yes    Types: Marijuana    Home Medications Prior to Admission medications   Medication Sig Start Date End Date Taking? Authorizing Provider  benzonatate (TESSALON) 100 MG capsule Take 1 capsule (100 mg total) by mouth every 8 (eight) hours. 08/30/18   Law, Waylan Boga, PA-C  doxycycline (VIBRAMYCIN) 100 MG capsule Take 1 capsule (100 mg total) by mouth 2  (two) times daily. 08/30/18   Law, Waylan Boga, PA-C  HYDROcodone-acetaminophen (NORCO/VICODIN) 5-325 MG tablet Take 2 tablets by mouth every 6 (six) hours as needed for moderate pain. 07/04/19   Lorelee New, PA-C  hydrOXYzine (ATARAX/VISTARIL) 25 MG tablet Take 1 tablet (25 mg total) by mouth every 8 (eight) hours as needed. Patient not taking: Reported on 06/21/2018 04/02/15   Janne Napoleon, NP  ibuprofen (ADVIL,MOTRIN) 800 MG tablet Take 1 tablet (800 mg total) by mouth every 8 (eight) hours as needed. 03/07/17   Lawyer, Cristal Deer, PA-C  loratadine (CLARITIN) 10 MG tablet Take 1 tablet (10 mg total) by mouth daily. Patient not taking: Reported on 06/21/2018 04/02/15   Janne Napoleon, NP  meloxicam (MOBIC) 7.5 MG tablet Take 1 tablet (7.5 mg total) by mouth 2 (two) times daily as needed for pain. 07/04/19   Lorelee New, PA-C  naproxen (NAPROSYN) 500 MG tablet Take 1 tablet (500 mg total) by mouth 2 (two) times daily. Patient not taking: Reported on 06/21/2018 05/07/15   Janne Napoleon, NP  traMADol (ULTRAM) 50 MG tablet Take 1 tablet (50 mg total) by mouth every 6 (six) hours as needed for severe pain. Patient not taking: Reported on 06/21/2018 03/07/17  Lawyer, Cristal Deer, PA-C    Allergies    Patient has no known allergies.  Review of Systems   Review of Systems  Constitutional: Negative for fever.  Respiratory: Negative for shortness of breath.   Cardiovascular: Negative for chest pain.  Gastrointestinal: Negative for abdominal pain.  Genitourinary: Positive for flank pain. Negative for dysuria.  All other systems reviewed and are negative.   Physical Exam Updated Vital Signs BP (!) 153/91   Pulse 91   Temp 97.8 F (36.6 C) (Oral)   Resp 20   Ht 1.829 m (6')   Wt 128.8 kg   SpO2 98%   BMI 38.52 kg/m   Physical Exam CONSTITUTIONAL: Well developed/well nourished HEAD: Normocephalic/atraumatic EYES: EOMI ENMT: Mucous membranes moist NECK: supple no meningeal  signs SPINE/BACK:entire spine nontender CV: S1/S2 noted, no murmurs/rubs/gallops noted LUNGS: Lungs are clear to auscultation bilaterally, no apparent distress ABDOMEN: soft, nontender, no rebound or guarding, bowel sounds noted throughout abdomen UR:KYHC cva tenderness NEURO: Pt is awake/alert/appropriate, moves all extremitiesx4.  No facial droop.   EXTREMITIES: pulses normal/equal, full ROM SKIN: warm, diaphoretic PSYCH: no abnormalities of mood noted, alert and oriented to situation  ED Results / Procedures / Treatments   Labs (all labs ordered are listed, but only abnormal results are displayed) Labs Reviewed  URINALYSIS, ROUTINE W REFLEX MICROSCOPIC - Abnormal; Notable for the following components:      Result Value   Hgb urine dipstick TRACE (*)    All other components within normal limits  BASIC METABOLIC PANEL - Abnormal; Notable for the following components:   Glucose, Bld 127 (*)    All other components within normal limits  CBC WITH DIFFERENTIAL/PLATELET - Abnormal; Notable for the following components:   WBC 15.9 (*)    RBC 5.82 (*)    Hemoglobin 17.2 (*)    Platelets 402 (*)    Neutro Abs 7.8 (*)    Lymphs Abs 5.9 (*)    Monocytes Absolute 1.2 (*)    Eosinophils Absolute 0.8 (*)    Abs Immature Granulocytes 0.13 (*)    All other components within normal limits  URINALYSIS, MICROSCOPIC (REFLEX)    EKG None  Radiology No results found.  Procedures Procedures   Medications Ordered in ED Medications  ketorolac (TORADOL) 30 MG/ML injection 30 mg (30 mg Intravenous Given 07/23/20 0452)  fentaNYL (SUBLIMAZE) injection 100 mcg (100 mcg Intravenous Given 07/23/20 0453)  ondansetron (ZOFRAN) injection 4 mg (4 mg Intravenous Given 07/23/20 0452)  fentaNYL (SUBLIMAZE) injection 100 mcg (100 mcg Intravenous Given 07/23/20 0656)  ondansetron (ZOFRAN) injection 4 mg (4 mg Intravenous Given 07/23/20 6237)    ED Course  I have reviewed the triage vital signs and  the nursing notes.  Pertinent labs & imaging results that were available during my care of the patient were reviewed by me and considered in my medical decision making (see chart for details).    MDM Rules/Calculators/A&P                          5:23 AM Patient presents with left flank pain.  Distant history of kidney stones and feels this is similar to prior.  Will treat pain and reassess 6:38 AM Patient reports return of pain and nausea.  Will treat pain, will proceed with CT imaging/labs 7:15 AM Signed out to dr Adela Lank with CT imaging pending  Final Clinical Impression(s) / ED Diagnoses Final diagnoses:  None    Rx /  DC Orders ED Discharge Orders    None       Zadie Rhine, MD 07/23/20 [phone]

## 2020-07-23 NOTE — ED Notes (Signed)
Pt states nausea is returning.

## 2020-07-23 NOTE — Discharge Instructions (Signed)
Take 4 over the counter ibuprofen tablets 3 times a day or 2 over-the-counter naproxen tablets twice a day for pain. Also take tylenol 1000mg (2 extra strength) four times a day.   Follow up with your family doc.  Return for worsening symptoms.

## 2022-01-22 ENCOUNTER — Other Ambulatory Visit: Payer: Self-pay

## 2022-01-22 ENCOUNTER — Emergency Department (HOSPITAL_BASED_OUTPATIENT_CLINIC_OR_DEPARTMENT_OTHER): Payer: BC Managed Care – PPO

## 2022-01-22 ENCOUNTER — Encounter (HOSPITAL_BASED_OUTPATIENT_CLINIC_OR_DEPARTMENT_OTHER): Payer: Self-pay

## 2022-01-22 ENCOUNTER — Emergency Department (HOSPITAL_BASED_OUTPATIENT_CLINIC_OR_DEPARTMENT_OTHER)
Admission: EM | Admit: 2022-01-22 | Discharge: 2022-01-22 | Disposition: A | Discharge status: Home / Self Care | Payer: BC Managed Care – PPO | Attending: Emergency Medicine | Admitting: Emergency Medicine

## 2022-01-22 DIAGNOSIS — R739 Hyperglycemia, unspecified: Secondary | ICD-10-CM

## 2022-01-22 DIAGNOSIS — R109 Unspecified abdominal pain: Secondary | ICD-10-CM

## 2022-01-22 DIAGNOSIS — R1033 Periumbilical pain: Secondary | ICD-10-CM | POA: Insufficient documentation

## 2022-01-22 LAB — CBC WITH DIFFERENTIAL/PLATELET
Abs Immature Granulocytes: 0.1 10*3/uL — ABNORMAL HIGH (ref 0.00–0.07)
Basophils Absolute: 0.1 10*3/uL (ref 0.0–0.1)
Basophils Relative: 1 %
Eosinophils Absolute: 0.8 10*3/uL — ABNORMAL HIGH (ref 0.0–0.5)
Eosinophils Relative: 6 %
HCT: 51.4 % (ref 39.0–52.0)
Hemoglobin: 17.8 g/dL — ABNORMAL HIGH (ref 13.0–17.0)
Immature Granulocytes: 1 %
Lymphocytes Relative: 35 %
Lymphs Abs: 4.6 10*3/uL — ABNORMAL HIGH (ref 0.7–4.0)
MCH: 29.3 pg (ref 26.0–34.0)
MCHC: 34.6 g/dL (ref 30.0–36.0)
MCV: 84.5 fL (ref 80.0–100.0)
Monocytes Absolute: 1.1 10*3/uL — ABNORMAL HIGH (ref 0.1–1.0)
Monocytes Relative: 9 %
Neutro Abs: 6.3 10*3/uL (ref 1.7–7.7)
Neutrophils Relative %: 48 %
Platelets: 375 10*3/uL (ref 150–400)
RBC: 6.08 MIL/uL — ABNORMAL HIGH (ref 4.22–5.81)
RDW: 13.4 % (ref 11.5–15.5)
WBC: 13 10*3/uL — ABNORMAL HIGH (ref 4.0–10.5)
nRBC: 0 % (ref 0.0–0.2)

## 2022-01-22 LAB — BASIC METABOLIC PANEL
Anion gap: 11 (ref 5–15)
BUN: 10 mg/dL (ref 6–20)
CO2: 23 mmol/L (ref 22–32)
Calcium: 9.9 mg/dL (ref 8.9–10.3)
Chloride: 102 mmol/L (ref 98–111)
Creatinine, Ser: 1.06 mg/dL (ref 0.61–1.24)
GFR, Estimated: >60 mL/min (ref >60)
Glucose, Bld: 213 mg/dL — ABNORMAL HIGH (ref 70–99)
Potassium: 4.1 mmol/L (ref 3.5–5.1)
Sodium: 136 mmol/L (ref 135–145)

## 2022-01-22 LAB — URINALYSIS, ROUTINE W REFLEX MICROSCOPIC
Bilirubin Urine: NEGATIVE
Glucose, UA: NEGATIVE mg/dL
Hgb urine dipstick: NEGATIVE
Ketones, ur: NEGATIVE mg/dL
Leukocytes,Ua: NEGATIVE
Nitrite: NEGATIVE
Protein, ur: 30 mg/dL — AB
Specific Gravity, Urine: >=1.03 (ref 1.005–1.030)
pH: 5.5 (ref 5.0–8.0)

## 2022-01-22 LAB — URINALYSIS, MICROSCOPIC (REFLEX)

## 2022-01-22 MED ORDER — LIDOCAINE 5 % EX PTCH: 1.0000 | MEDICATED_PATCH | CUTANEOUS | 0 refills | Status: AC

## 2022-01-22 MED ORDER — NAPROXEN 500 MG PO TABS: 500.0000 mg | ORAL_TABLET | Freq: Two times a day (BID) | ORAL | 0 refills | Status: AC

## 2022-01-22 MED ORDER — KETOROLAC TROMETHAMINE 60 MG/2ML IM SOLN
60.0000 mg | Freq: Once | INTRAMUSCULAR | Status: AC
  Administered 2022-01-22: 60 mg via INTRAMUSCULAR
  Filled 2022-01-22: qty 2

## 2022-01-22 MED ORDER — METFORMIN HCL 500 MG PO TABS: 500.0000 mg | ORAL_TABLET | Freq: Two times a day (BID) | ORAL | 0 refills | Status: AC

## 2022-01-22 NOTE — ED Triage Notes (Signed)
Pt c/o umbilical pain intermittently since Sunday. Pt reports taking a shower, cleaning belly button then had stabbing pain that shot down into his penis. Pt report pain went away, pt reports pain intermittently since. Pt reports it felt like someone reached into his belly button and pulled everything out.  ?

## 2022-01-22 NOTE — ED Notes (Signed)
Patient transported to CT 

## 2022-01-22 NOTE — ED Provider Notes (Signed)
?[facility] EMERGENCY DEPARTMENT ?Provider Note ? ? ?CSN: [id number] ?Arrival date & time: 01/22/22  0151 ? ?  ? ?History ? ?Chief Complaint  ?Patient presents with  ? Abdominal Pain  ? ? ?Reginald Castillo is a 41 y.o. male. ? ?The history is provided by the patient.  ?Abdominal Pain ?Pain location:  Periumbilical ?Pain quality: shooting   ?Pain radiation: penis. ?Pain severity:  Severe ?Onset quality:  Sudden ?Duration:  1 hour ?Timing:  Constant ?Progression:  Unchanged ?Chronicity:  New ?Context comment:  Lifting at work ?Relieved by:  Nothing ?Worsened by:  Nothing ?Ineffective treatments:  None tried ?Associated symptoms: no chest pain, no constipation, no diarrhea, no dysuria, no fever and no vomiting   ?Risk factors: has not had multiple surgeries   ?Abdominal pain radiating into the penis suddenly post lifting a heavy box at work.   ?  ? ?Home Medications ?Prior to Admission medications   ?Medication Sig Start Date End Date Taking? Authorizing Provider  ?benzonatate (TESSALON) 100 MG capsule Take 1 capsule (100 mg total) by mouth every 8 (eight) hours. 08/30/18   Frederica Kuster, PA-C  ?doxycycline (VIBRAMYCIN) 100 MG capsule Take 1 capsule (100 mg total) by mouth 2 (two) times daily. 08/30/18   Frederica Kuster, PA-C  ?HYDROcodone-acetaminophen (NORCO/VICODIN) 5-325 MG tablet Take 2 tablets by mouth every 6 (six) hours as needed for moderate pain. 07/04/19   Corena Herter, PA-C  ?hydrOXYzine (ATARAX/VISTARIL) 25 MG tablet Take 1 tablet (25 mg total) by mouth every 8 (eight) hours as needed. ?Patient not taking: Reported on 06/21/2018 04/02/15   Ashley Murrain, NP  ?ibuprofen (ADVIL,MOTRIN) 800 MG tablet Take 1 tablet (800 mg total) by mouth every 8 (eight) hours as needed. 03/07/17   Lawyer, Harrell Gave, PA-C  ?loratadine (CLARITIN) 10 MG tablet Take 1 tablet (10 mg total) by mouth daily. ?Patient not taking: Reported on 06/21/2018 04/02/15   Ashley Murrain, NP  ?meloxicam (MOBIC) 7.5 MG tablet Take 1  tablet (7.5 mg total) by mouth 2 (two) times daily as needed for pain. 07/04/19   Corena Herter, PA-C  ?naproxen (NAPROSYN) 500 MG tablet Take 1 tablet (500 mg total) by mouth 2 (two) times daily. ?Patient not taking: Reported on 06/21/2018 05/07/15   Ashley Murrain, NP  ?traMADol (ULTRAM) 50 MG tablet Take 1 tablet (50 mg total) by mouth every 6 (six) hours as needed for severe pain. ?Patient not taking: Reported on 06/21/2018 03/07/17   Dalia Heading, PA-C  ?   ? ?Allergies    ?Patient has no known allergies.   ? ?Review of Systems   ?Review of Systems  ?Constitutional:  Negative for fever.  ?HENT:  Negative for facial swelling.   ?Eyes:  Negative for redness.  ?Respiratory:  Negative for wheezing and stridor.   ?Cardiovascular:  Negative for chest pain.  ?Gastrointestinal:  Positive for abdominal pain. Negative for constipation, diarrhea and vomiting.  ?Genitourinary:  Negative for dysuria and flank pain.  ?Musculoskeletal:  Negative for neck pain.  ?Skin:  Negative for rash and wound.  ?Neurological:  Negative for facial asymmetry.  ?Psychiatric/Behavioral:  Negative for agitation.   ? ?Physical Exam ?Updated Vital Signs ?BP (!) 171/114 (BP Location: Right Arm)   Pulse 88   Temp 97.9 ?F (36.6 ?C) (Oral)   Resp 17   Ht 6\' 1"  (1.854 m)   Wt 127.5 kg   SpO2 99%   BMI 37.07 kg/m?  ?Physical Exam ?Vitals and nursing  note reviewed.  ?Constitutional:   ?   General: He is not in acute distress. ?   Appearance: Normal appearance.  ?HENT:  ?   Head: Normocephalic and atraumatic.  ?   Nose: Nose normal.  ?Eyes:  ?   Conjunctiva/sclera: Conjunctivae normal.  ?   Pupils: Pupils are equal, round, and reactive to light.  ?Cardiovascular:  ?   Rate and Rhythm: Normal rate and regular rhythm.  ?   Pulses: Normal pulses.  ?   Heart sounds: Normal heart sounds.  ?Pulmonary:  ?   Effort: Pulmonary effort is normal.  ?   Breath sounds: Normal breath sounds.  ?Abdominal:  ?   General: Bowel sounds are normal.  ?    Palpations: Abdomen is soft.  ?   Tenderness: There is no abdominal tenderness. There is no guarding.  ?Musculoskeletal:     ?   General: Normal range of motion.  ?   Cervical back: Normal range of motion and neck supple.  ?Skin: ?   General: Skin is warm and dry.  ?   Capillary Refill: Capillary refill takes less than 2 seconds.  ?Neurological:  ?   General: No focal deficit present.  ?   Mental Status: He is alert and oriented to person, place, and time.  ?   Deep Tendon Reflexes: Reflexes normal.  ?Psychiatric:     ?   Mood and Affect: Mood normal.     ?   Behavior: Behavior normal.  ? ? ?ED Results / Procedures / Treatments   ?Labs ?(all labs ordered are listed, but only abnormal results are displayed) ?Labs Reviewed  ?CBC WITH DIFFERENTIAL/PLATELET  ?BASIC METABOLIC PANEL  ?URINALYSIS, ROUTINE W REFLEX MICROSCOPIC  ? ? ?EKG ?None ? ?Radiology ?CT Renal Stone Study ? ?Result Date: 01/22/2022 ?CLINICAL DATA:  Flank pain, kidney stone suspected EXAM: CT ABDOMEN AND PELVIS WITHOUT CONTRAST TECHNIQUE: Multidetector CT imaging of the abdomen and pelvis was performed following the standard protocol without IV contrast. RADIATION DOSE REDUCTION: This exam was performed according to the departmental dose-optimization program which includes automated exposure control, adjustment of the mA and/or kV according to patient size and/or use of iterative reconstruction technique. COMPARISON:  None. FINDINGS: Lower chest: No acute abnormality. Hepatobiliary: Diffuse low-density throughout the liver compatible with fatty infiltration. No focal abnormality. Gallbladder unremarkable. Pancreas: No focal abnormality or ductal dilatation. Spleen: No focal abnormality.  Normal size. Adrenals/Urinary Tract: No adrenal abnormality. No focal renal abnormality. No stones or hydronephrosis. Urinary bladder is unremarkable. Stomach/Bowel: Normal appendix. Stomach, large and small bowel grossly unremarkable. Vascular/Lymphatic: Aortic  atherosclerosis. No evidence of aneurysm or adenopathy. Reproductive: No visible focal abnormality. Other: No free fluid or free air. Musculoskeletal: No acute bony abnormality. IMPRESSION: No renal or ureteral stones.  No hydronephrosis. Fatty liver. Aortic atherosclerosis. No acute findings. Electronically Signed   By: Rolm Baptise M.D.   On: 01/22/2022 02:17   ? ?Procedures ?Procedures  ? ? ?Medications Ordered in ED ?Medications  ?ketorolac (TORADOL) injection 60 mg (has no administration in time range)  ? ? ?ED Course/ Medical Decision Making/ A&P ?  ?                        ?Medical Decision Making ?Abdominal pain radiating into the penis  ? ?Amount and/or Complexity of Data Reviewed ?External Data Reviewed: notes. ?   Details: previous ED notes ?Labs: ordered. ?   Details: all labs reviewed:  Hemoglobin elevated at  17.8, normal platelets.  normal creatinine on Chemistry, normal sodium.  glucose is 213 (not previously known to be a diabetic), normal anion gap of 11 ?Radiology: ordered. ?   Details: No acute finding on CT by me ? ?Risk ?Prescription drug management. ?Risk Details: Likely MSK as started post lifting.  No hernias, no stones, appendix is normal.  Incidentally founding to have a fasting glucose above 200.  Normal anion gap.  Based on the American Diabetes Association this likely makes patient a type 2 diabetic.  Will start metformin while patient is waiting to see PMD for further testing.   ? ? ? ?Final Clinical Impression(s) / ED Diagnoses ?Final diagnoses:  ?None  ?Return for intractable cough, coughing up blood, fevers > 100.4 unrelieved by medication, shortness of breath, intractable vomiting, chest pain, shortness of breath, weakness, numbness, changes in speech, facial asymmetry, abdominal pain, passing out, Inability to tolerate liquids or food, cough, altered mental status or any concerns. No signs of systemic illness or infection. The patient is nontoxic-appearing on exam and vital signs  are within normal limits.  ?I have reviewed the triage vital signs and the nursing notes. Pertinent labs & imaging results that were available during my care of the patient were reviewed by me and considered in my medical decision

## 2023-12-22 IMAGING — CT CT RENAL STONE PROTOCOL
2 of 4 series · 17 of 46 positions shown, 19 images · non-contrast
Comparison: None.

CLINICAL DATA: Flank pain, kidney stone suspected



[Series 2: axial st · axial · 0.96mm/px · z∈[+602,+1062]mm · 14 of 101 slices shown, 16 images]
[im 5/101  soft-tissue]
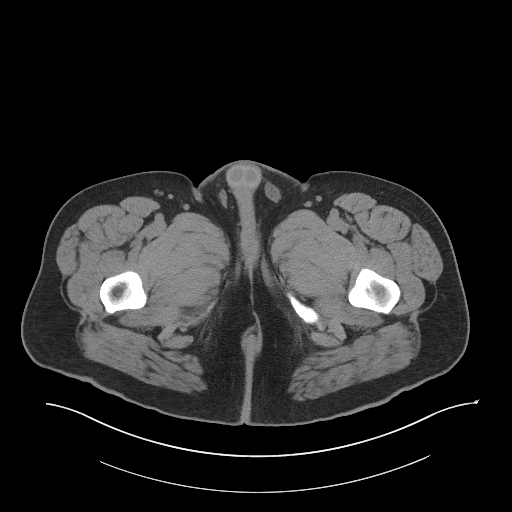
[im 5/101  bone]
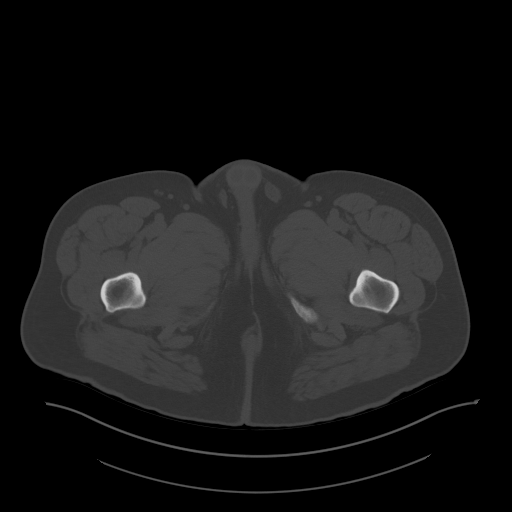
[im 13/101  soft-tissue]
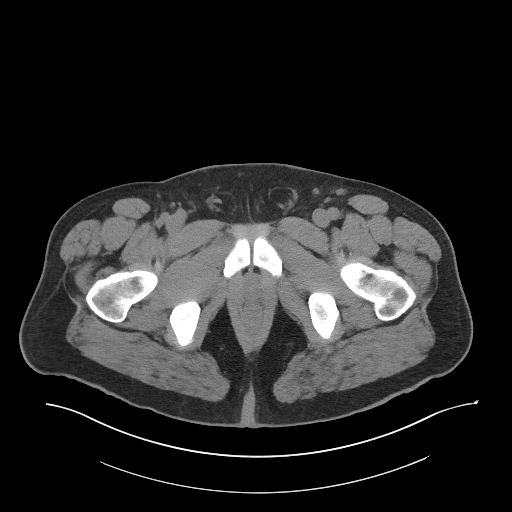
[im 21/101  soft-tissue]
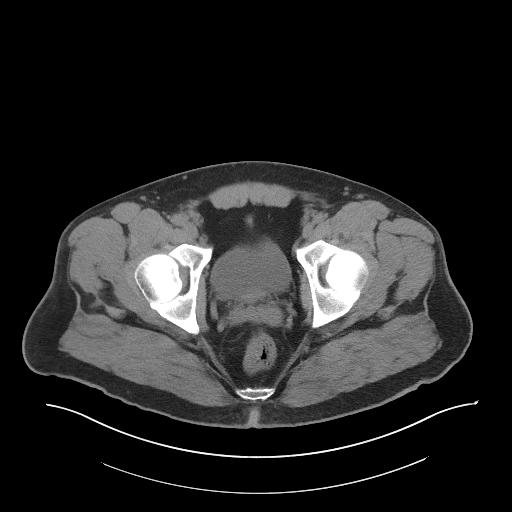
[im 29/101  soft-tissue]
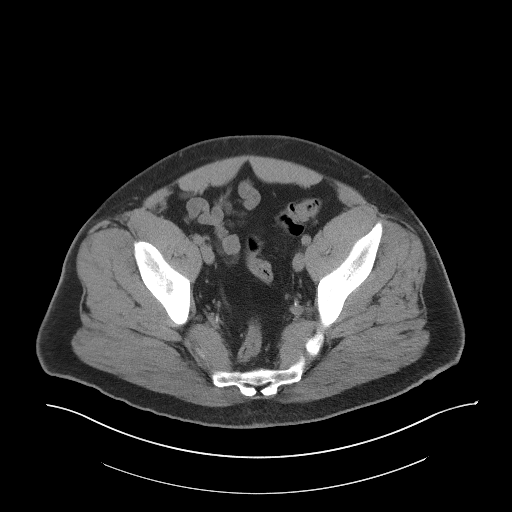
[im 33/101  soft-tissue]
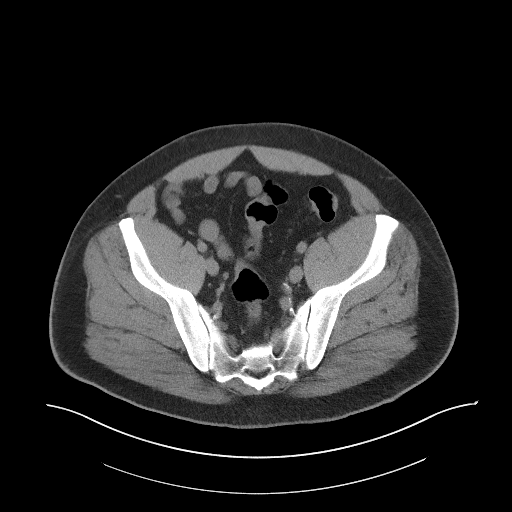
[im 41/101  soft-tissue]
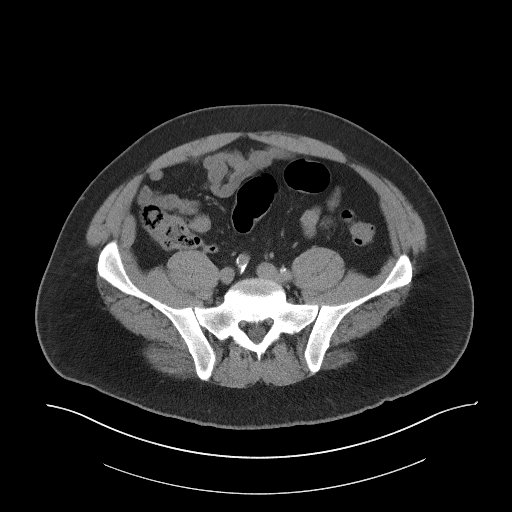
[im 49/101  soft-tissue]
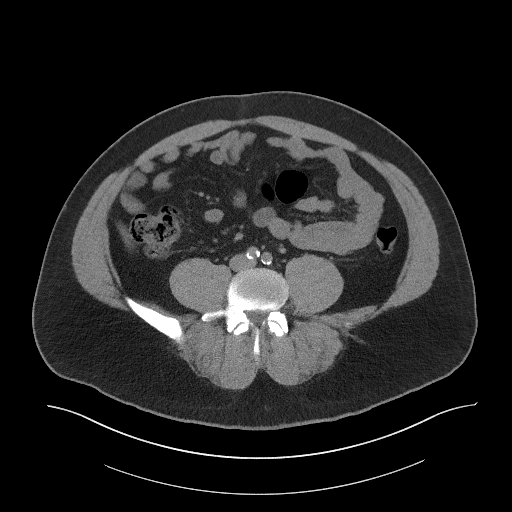
[im 53/101  soft-tissue]
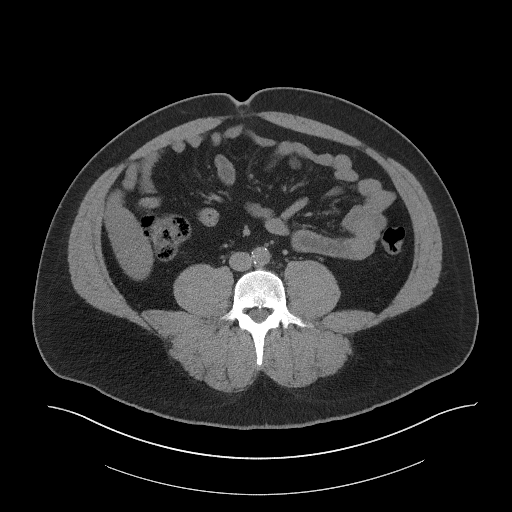
[im 61/101  soft-tissue]
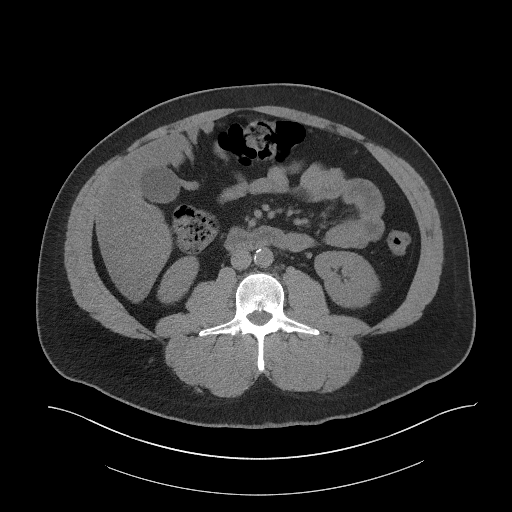
[im 61/101  bone]
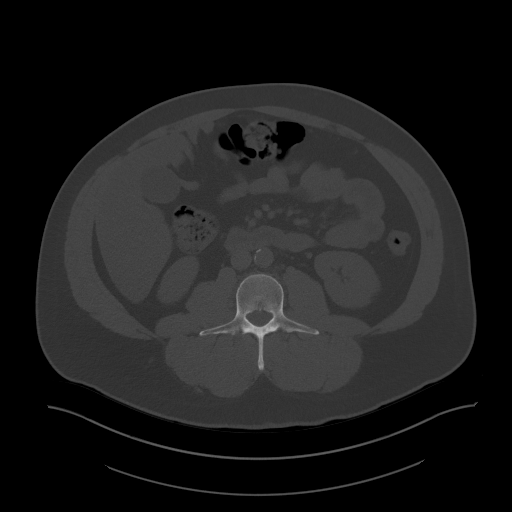
[im 69/101  soft-tissue]
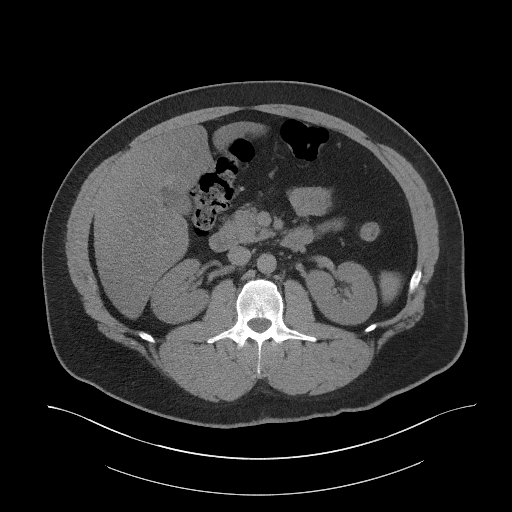
[im 77/101  soft-tissue]
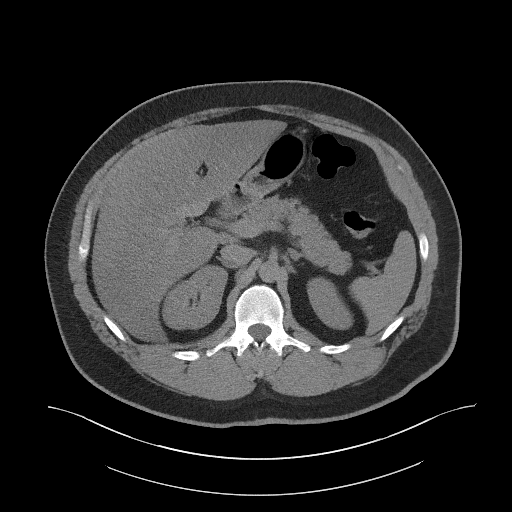
[im 81/101  soft-tissue]
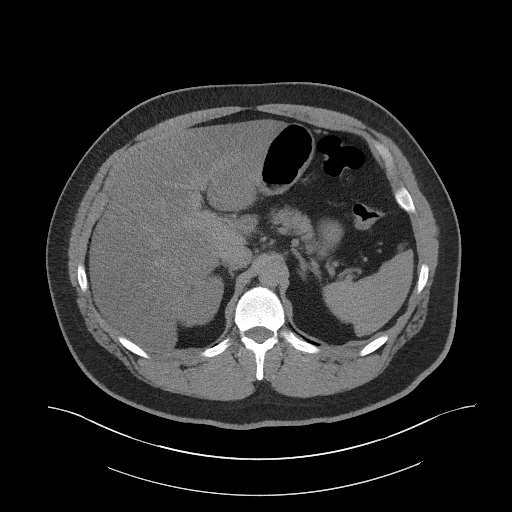
[im 89/101  soft-tissue]
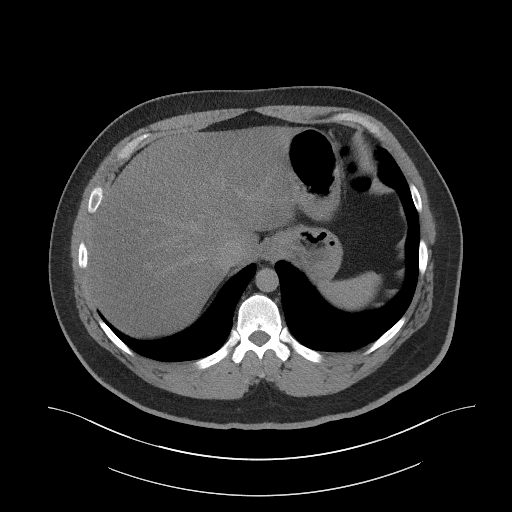
[im 97/101  soft-tissue]
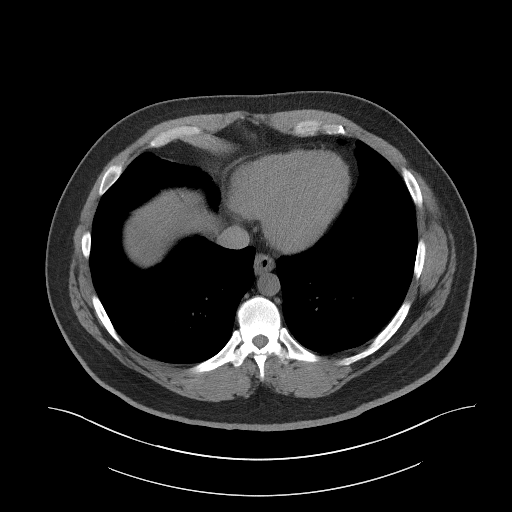

[Series 5: coronal st · coronal · 0.89mm/px · 3 of 100 slices shown]
[im 34/100  soft-tissue]
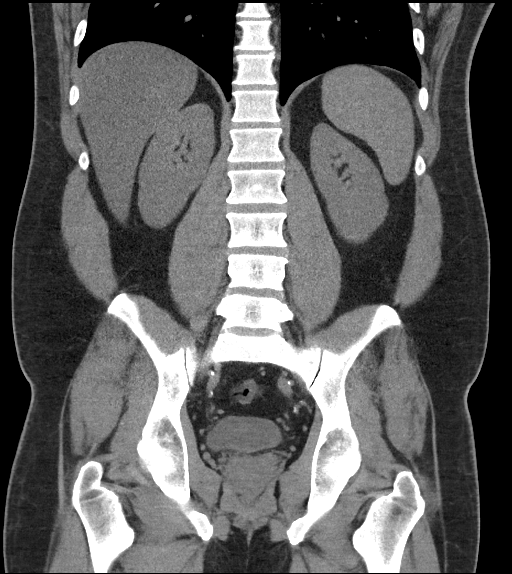
[im 45/100  soft-tissue]
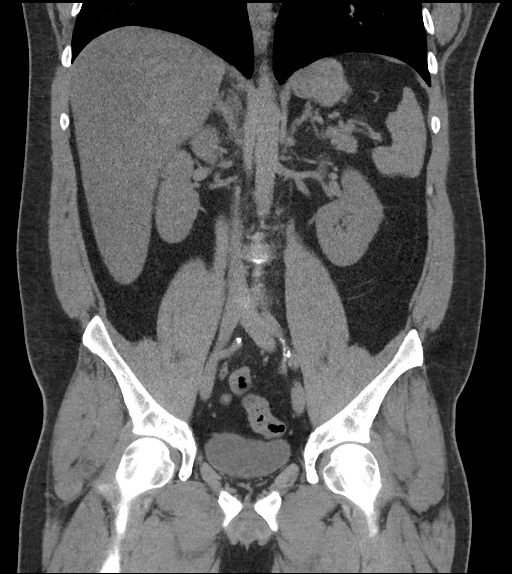
[im 56/100  soft-tissue]
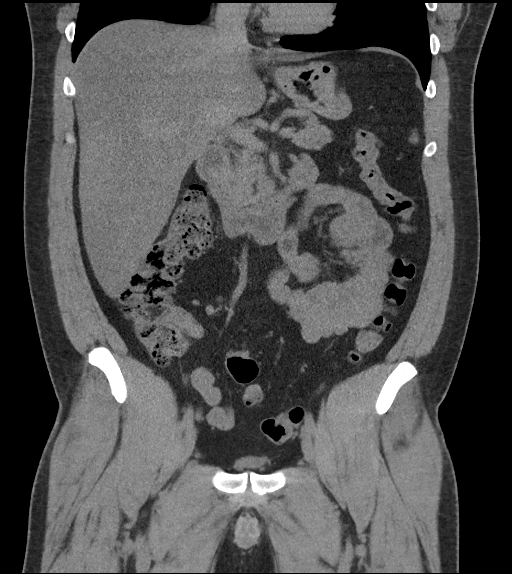

[17 of 46 positions shown; findings below may reference images not displayed]

FINDINGS: Lower chest: No acute abnormality.

Hepatobiliary: Diffuse low-density throughout the liver compatible
with fatty infiltration. No focal abnormality. Gallbladder
unremarkable.

Pancreas: No focal abnormality or ductal dilatation.

Spleen: No focal abnormality.  Normal size.

Adrenals/Urinary Tract: No adrenal abnormality. No focal renal
abnormality. No stones or hydronephrosis. Urinary bladder is
unremarkable.

Stomach/Bowel: Normal appendix. Stomach, large and small bowel
grossly unremarkable.

Vascular/Lymphatic: Aortic atherosclerosis. No evidence of aneurysm
or adenopathy.

Reproductive: No visible focal abnormality.

Other: No free fluid or free air.

Musculoskeletal: No acute bony abnormality.
IMPRESSION: No renal or ureteral stones.  No hydronephrosis.

Fatty liver.

Aortic atherosclerosis.

No acute findings.

## 2024-04-05 ENCOUNTER — Emergency Department (HOSPITAL_BASED_OUTPATIENT_CLINIC_OR_DEPARTMENT_OTHER)
Admission: EM | Admit: 2024-04-05 | Discharge: 2024-04-05 | Disposition: A | Discharge status: Home / Self Care | Payer: Self-pay | Attending: Emergency Medicine | Admitting: Emergency Medicine

## 2024-04-05 ENCOUNTER — Encounter (HOSPITAL_BASED_OUTPATIENT_CLINIC_OR_DEPARTMENT_OTHER): Payer: Self-pay | Admitting: Emergency Medicine

## 2024-04-05 ENCOUNTER — Emergency Department (HOSPITAL_BASED_OUTPATIENT_CLINIC_OR_DEPARTMENT_OTHER): Payer: Self-pay

## 2024-04-05 ENCOUNTER — Other Ambulatory Visit: Payer: Self-pay

## 2024-04-05 DIAGNOSIS — Z7984 Long term (current) use of oral hypoglycemic drugs: Secondary | ICD-10-CM | POA: Insufficient documentation

## 2024-04-05 DIAGNOSIS — X501XXA Overexertion from prolonged static or awkward postures, initial encounter: Secondary | ICD-10-CM | POA: Insufficient documentation

## 2024-04-05 DIAGNOSIS — S63501A Unspecified sprain of right wrist, initial encounter: Secondary | ICD-10-CM | POA: Insufficient documentation

## 2024-04-05 DIAGNOSIS — R739 Hyperglycemia, unspecified: Secondary | ICD-10-CM

## 2024-04-05 DIAGNOSIS — E1165 Type 2 diabetes mellitus with hyperglycemia: Secondary | ICD-10-CM | POA: Insufficient documentation

## 2024-04-05 LAB — CBG MONITORING, ED: Glucose-Capillary: 241 mg/dL — ABNORMAL HIGH (ref 70–99)

## 2024-04-05 MED ORDER — IBUPROFEN 600 MG PO TABS: 600.0000 mg | ORAL_TABLET | Freq: Four times a day (QID) | ORAL | 0 refills | Status: AC | PRN

## 2024-04-05 MED ORDER — SEMAGLUTIDE 3 MG PO TABS: 3.0000 mg | ORAL_TABLET | Freq: Every day | ORAL | 0 refills | Status: AC

## 2024-04-05 MED ORDER — KETOROLAC TROMETHAMINE 30 MG/ML IJ SOLN
30.0000 mg | Freq: Once | INTRAMUSCULAR | Status: AC
  Administered 2024-04-05: 30 mg via INTRAMUSCULAR
  Filled 2024-04-05: qty 1

## 2024-04-05 MED ORDER — HYDROCODONE-ACETAMINOPHEN 5-325 MG PO TABS: 1.0000 | ORAL_TABLET | ORAL | 0 refills | Status: AC | PRN

## 2024-04-05 NOTE — ED Provider Notes (Signed)
 [place] [facility] Provider Note   CSN: [id number] Arrival date & time: 04/05/24  9366     Patient presents with: Wrist Pain   Reginald Castillo is a 43 y.o. male.   Pt is a 43 yo male with pmhx significant for prior wrist injury.  He said he was working on his truck last night and was using a drill.  He said the drill bit got stuck and the drill kicked back on him.  He said it twisted his wrist around.  He is unable to move his right wrist.         Prior to Admission medications   Medication Sig Start Date End Date Taking? Authorizing Provider  HYDROcodone -acetaminophen  (NORCO/VICODIN) 5-325 MG tablet Take 1 tablet by mouth every 4 (four) hours as needed. 04/05/24  Yes Dean Clarity, MD  ibuprofen  (ADVIL ) 600 MG tablet Take 1 tablet (600 mg total) by mouth every 6 (six) hours as needed. 04/05/24  Yes Dean Clarity, MD  Semaglutide  3 MG TABS Take 1 tablet (3 mg total) by mouth daily. 04/05/24  Yes Dean Clarity, MD  benzonatate  (TESSALON ) 100 MG capsule Take 1 capsule (100 mg total) by mouth every 8 (eight) hours. 08/30/18   Law, Lorane HERO, PA-C  doxycycline  (VIBRAMYCIN ) 100 MG capsule Take 1 capsule (100 mg total) by mouth 2 (two) times daily. 08/30/18   Law, Alexandra M, PA-C  HYDROcodone -acetaminophen  (NORCO/VICODIN) 5-325 MG tablet Take 2 tablets by mouth every 6 (six) hours as needed for moderate pain. 07/04/19   Landy Honora CROME, PA-C  hydrOXYzine  (ATARAX /VISTARIL ) 25 MG tablet Take 1 tablet (25 mg total) by mouth every 8 (eight) hours as needed. Patient not taking: Reported on 06/21/2018 04/02/15   Jamelle Lorrayne HERO, NP  ibuprofen  (ADVIL ,MOTRIN ) 800 MG tablet Take 1 tablet (800 mg total) by mouth every 8 (eight) hours as needed. 03/07/17   Lawyer, Lonni, PA-C  lidocaine  (LIDODERM ) 5 % Place 1 patch onto the skin daily. Remove & Discard patch within 12 hours or as directed by MD 01/22/22   Nettie, April, MD  loratadine  (CLARITIN ) 10 MG tablet  Take 1 tablet (10 mg total) by mouth daily. Patient not taking: Reported on 06/21/2018 04/02/15   Jamelle Lorrayne HERO, NP  meloxicam  (MOBIC ) 7.5 MG tablet Take 1 tablet (7.5 mg total) by mouth 2 (two) times daily as needed for pain. 07/04/19   Landy Honora CROME, PA-C  metFORMIN  (GLUCOPHAGE ) 500 MG tablet Take 1 tablet (500 mg total) by mouth 2 (two) times daily with a meal. 01/22/22   Palumbo, April, MD  naproxen  (NAPROSYN ) 500 MG tablet Take 1 tablet (500 mg total) by mouth 2 (two) times daily. Patient not taking: Reported on 06/21/2018 05/07/15   Jamelle Lorrayne HERO, NP  naproxen  (NAPROSYN ) 500 MG tablet Take 1 tablet (500 mg total) by mouth 2 (two) times daily with a meal. 01/22/22   Palumbo, April, MD  traMADol  (ULTRAM ) 50 MG tablet Take 1 tablet (50 mg total) by mouth every 6 (six) hours as needed for severe pain. Patient not taking: Reported on 06/21/2018 03/07/17   Tracey Lonni, PA-C    Allergies: Patient has no known allergies.    Review of Systems  Musculoskeletal:        Wrist pain  All other systems reviewed and are negative.   Updated Vital Signs BP (!) 174/102 (BP Location: Left Arm)   Pulse 97   Temp 98.8 F (37.1 C) (Oral)   Resp  16   Ht 6' 1 (1.854 m)   Wt 120.2 kg   SpO2 99%   BMI 34.96 kg/m   Physical Exam Vitals and nursing note reviewed.  Constitutional:      Appearance: Normal appearance.  HENT:     Head: Normocephalic and atraumatic.     Right Ear: External ear normal.     Left Ear: External ear normal.     Nose: Nose normal.     Mouth/Throat:     Mouth: Mucous membranes are moist.     Pharynx: Oropharynx is clear.  Eyes:     Extraocular Movements: Extraocular movements intact.     Conjunctiva/sclera: Conjunctivae normal.     Pupils: Pupils are equal, round, and reactive to light.  Cardiovascular:     Rate and Rhythm: Normal rate and regular rhythm.     Pulses: Normal pulses.     Heart sounds: Normal heart sounds.  Pulmonary:     Effort: Pulmonary effort is  normal.     Breath sounds: Normal breath sounds.  Abdominal:     General: Abdomen is flat. Bowel sounds are normal.     Palpations: Abdomen is soft.  Musculoskeletal:       Arms:     Cervical back: Normal range of motion and neck supple.  Skin:    General: Skin is warm.     Capillary Refill: Capillary refill takes less than 2 seconds.  Neurological:     General: No focal deficit present.     Mental Status: He is alert.  Psychiatric:        Mood and Affect: Mood normal.     (all labs ordered are listed, but only abnormal results are displayed) Labs Reviewed  CBG MONITORING, ED - Abnormal; Notable for the following components:      Result Value   Glucose-Capillary 241 (*)    All other components within normal limits    EKG: None  Radiology: DG Wrist Complete Right Result Date: 04/05/2024 EXAM: 4 VIEW(S) XRAY OF THE RIGHT WRIST 04/05/2024 07:29:00 AM COMPARISON: Right wrist radiographs 05/07/2015. CLINICAL HISTORY: Rt ulnar wrist pain. Was using a drill last night when it twisted on his wrist. Hx of wrist surg/bone graft x2. FINDINGS: BONES AND JOINTS: No acute fracture. 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 SOFT TISSUES: The soft tissues are unremarkable. IMPRESSION: 1. 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 2. Fragmentation of the scaphoid. 3. No acute findings. Electronically signed by: Lonni Necessary MD 04/05/2024 07:35 AM EDT RP Workstation: [id number]     Procedures   Medications Ordered in the ED  ketorolac  (TORADOL ) 30 MG/ML injection 30 mg (30 mg Intramuscular Given 04/05/24 0718)                                    Medical Decision Making Risk Prescription drug management.   This patient presents to the ED for concern of right wrist pain, this involves an extensive number of treatment options, and is a complaint that carries with it a high risk of complications and morbidity.  The differential  diagnosis includes fx, sprain   Co morbidities that complicate the patient evaluation  Prior wrist injury   Additional history obtained:  Additional history obtained from epic chart review  Lab Tests:  I Ordered, and personally interpreted labs.  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  2. Fragmentation of the scaphoid.  3. No acute findings.   I agree with the radiologist interpretation  Medicines ordered and prescription drug management:  I ordered medication including toradol   for sx  Reevaluation of the patient after these medicines showed that the patient improved I have reviewed the patients home medicines and have made adjustments as needed   Problem List / ED Course:  Wrist pain:  likely sprain.  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  Pt placed in a velcro splint and is referred to hand. Hyperglycemia due to DM2:  pt stopped taking metformin  and glyburide because they made him feel bad.  We will try semaglutide .  Pt said his A1c at his pcp was high last summer when he last went, but he does not know the number and we don't have it on epic.  I told him he needs to f/u with pcp as it's very important to get bs under control.    Reevaluation:  After the interventions noted above, I reevaluated the patient and found that they have :improved   Social Determinants of Health:  Lives at home   Dispostion:  After consideration of the diagnostic results and the patients response to treatment, I feel that the patent would benefit from discharge with outpatient f/u.       Final diagnoses:  Hyperglycemia  Sprain of right wrist, initial encounter    ED Discharge Orders          Ordered    HYDROcodone -acetaminophen  (NORCO/VICODIN) 5-325 MG  tablet  Every 4 hours PRN        04/05/24 0755    ibuprofen  (ADVIL ) 600 MG tablet  Every 6 hours PRN        04/05/24 0755    Semaglutide  3 MG TABS  Daily        04/05/24 0757               Dean Clarity, MD 04/05/24 0800

## 2024-04-05 NOTE — Discharge Instructions (Addendum)
 Follow up with your doctor at [facility] to recheck your blood sugar. After 30 days, your dose will need to be increased to 7 mg if you are tolerating the 3 mg.  Your doctor will need to write that prescription.

## 2024-04-05 NOTE — ED Triage Notes (Signed)
 Pt was working last night and his drill kicked back on me meaning it spun around.  His unable to move his right wrist.  Pt has had two prior surgeries on this same wrist.
